# Patient Record
Sex: Female | Born: 1995 | Race: Black or African American | Hispanic: No | Marital: Single | Smoking: Never smoker
Health system: Southern US, Community
[De-identification: ages and names within clinical notes are randomized; demographics above are authoritative.]

## PROBLEM LIST (undated history)

## (undated) ENCOUNTER — Inpatient Hospital Stay (HOSPITAL_COMMUNITY): Payer: Self-pay

## (undated) DIAGNOSIS — Z789 Other specified health status: Secondary | ICD-10-CM

## (undated) HISTORY — PX: NO PAST SURGERIES: SHX2092

---

## 2015-10-10 NOTE — L&D Delivery Note (Signed)
Delivery Note At 3:50 AM a viable female was delivered via Vaginal, Spontaneous Delivery (presentation: LOA with left hand compound presentation;left arm swept without difficulty).  APGAR: 8, 9; weight pending  .   Placenta status: Veatrice KellsShultz, . 3V Cord:  Anesthesia:  Epidural Episiotomy: None Lacerations: Bilateral periurethral, hemostatis no repair Est. Blood Loss (mL): 75  Mom to postpartum.  Baby to Couplet care / Skin to Skin.  Stacie Diette 06/04/2016, 4:09 AM  I was present for the entire delivery of baby and placenta and inspection of the perineum and agree with above. Placenta to: Birthing suites Feeding: Breast Circ: OP Contraception: Condoms  [place] Pegge [place], CNM 06/04/2016 4:25 AM

## 2015-12-09 LAB — OB RESULTS CONSOLE GC/CHLAMYDIA
Chlamydia: NEGATIVE
GC PROBE AMP, GENITAL: NEGATIVE

## 2015-12-09 LAB — OB RESULTS CONSOLE HEPATITIS B SURFACE ANTIGEN: Hepatitis B Surface Ag: NEGATIVE

## 2015-12-09 LAB — OB RESULTS CONSOLE ABO/RH: RH TYPE: POSITIVE

## 2015-12-09 LAB — OB RESULTS CONSOLE HIV ANTIBODY (ROUTINE TESTING): HIV: NONREACTIVE

## 2015-12-09 LAB — OB RESULTS CONSOLE ANTIBODY SCREEN: ANTIBODY SCREEN: NEGATIVE

## 2015-12-09 LAB — OB RESULTS CONSOLE RUBELLA ANTIBODY, IGM: Rubella: IMMUNE

## 2015-12-09 LAB — OB RESULTS CONSOLE RPR
RPR: NONREACTIVE
RPR: NONREACTIVE

## 2016-02-20 ENCOUNTER — Encounter (HOSPITAL_COMMUNITY): Payer: Self-pay | Admitting: *Deleted

## 2016-02-20 ENCOUNTER — Emergency Department (HOSPITAL_COMMUNITY)
Admission: EM | Admit: 2016-02-20 | Discharge: 2016-02-20 | Disposition: A | Discharge status: Home / Self Care | Payer: Medicaid Other | Attending: Emergency Medicine | Admitting: Emergency Medicine

## 2016-02-20 ENCOUNTER — Emergency Department (HOSPITAL_COMMUNITY): Payer: Medicaid Other

## 2016-02-20 DIAGNOSIS — Z79899 Other long term (current) drug therapy: Secondary | ICD-10-CM | POA: Insufficient documentation

## 2016-02-20 DIAGNOSIS — E876 Hypokalemia: Secondary | ICD-10-CM | POA: Diagnosis not present

## 2016-02-20 DIAGNOSIS — O99282 Endocrine, nutritional and metabolic diseases complicating pregnancy, second trimester: Secondary | ICD-10-CM | POA: Diagnosis not present

## 2016-02-20 DIAGNOSIS — Z3A26 26 weeks gestation of pregnancy: Secondary | ICD-10-CM | POA: Diagnosis not present

## 2016-02-20 DIAGNOSIS — R109 Unspecified abdominal pain: Secondary | ICD-10-CM | POA: Diagnosis not present

## 2016-02-20 DIAGNOSIS — O26899 Other specified pregnancy related conditions, unspecified trimester: Secondary | ICD-10-CM

## 2016-02-20 DIAGNOSIS — O9989 Other specified diseases and conditions complicating pregnancy, childbirth and the puerperium: Secondary | ICD-10-CM | POA: Insufficient documentation

## 2016-02-20 LAB — URINALYSIS, ROUTINE W REFLEX MICROSCOPIC
BILIRUBIN URINE: NEGATIVE
GLUCOSE, UA: NEGATIVE mg/dL
HGB URINE DIPSTICK: NEGATIVE
KETONES UR: NEGATIVE mg/dL
NITRITE: NEGATIVE
PH: 7 (ref 5.0–8.0)
Protein, ur: NEGATIVE mg/dL
SPECIFIC GRAVITY, URINE: 1.015 (ref 1.005–1.030)

## 2016-02-20 LAB — COMPREHENSIVE METABOLIC PANEL
ALK PHOS: 56 U/L (ref 38–126)
ALT: 9 U/L — AB (ref 14–54)
AST: 16 U/L (ref 15–41)
Albumin: 2.4 g/dL — ABNORMAL LOW (ref 3.5–5.0)
Anion gap: 9 (ref 5–15)
BUN: <5 mg/dL — ABNORMAL LOW (ref 6–20)
CALCIUM: 8.3 mg/dL — AB (ref 8.9–10.3)
CHLORIDE: 108 mmol/L (ref 101–111)
CO2: 20 mmol/L — AB (ref 22–32)
CREATININE: 0.52 mg/dL (ref 0.44–1.00)
GFR calc Af Amer: >60 mL/min (ref >60)
GFR calc non Af Amer: >60 mL/min (ref >60)
GLUCOSE: 97 mg/dL (ref 65–99)
Potassium: 3.1 mmol/L — ABNORMAL LOW (ref 3.5–5.1)
SODIUM: 137 mmol/L (ref 135–145)
Total Bilirubin: 0.5 mg/dL (ref 0.3–1.2)
Total Protein: 5.7 g/dL — ABNORMAL LOW (ref 6.5–8.1)

## 2016-02-20 LAB — CBC WITH DIFFERENTIAL/PLATELET
Basophils Absolute: 0 10*3/uL (ref 0.0–0.1)
Basophils Relative: 0 %
EOS ABS: 0.2 10*3/uL (ref 0.0–0.7)
EOS PCT: 2 %
HCT: 30.3 % — ABNORMAL LOW (ref 36.0–46.0)
HEMOGLOBIN: 10 g/dL — AB (ref 12.0–15.0)
LYMPHS ABS: 1.9 10*3/uL (ref 0.7–4.0)
LYMPHS PCT: 24 %
MCH: 29.8 pg (ref 26.0–34.0)
MCHC: 33 g/dL (ref 30.0–36.0)
MCV: 90.2 fL (ref 78.0–100.0)
MONOS PCT: 8 %
Monocytes Absolute: 0.7 10*3/uL (ref 0.1–1.0)
NEUTROS PCT: 66 %
Neutro Abs: 5.1 10*3/uL (ref 1.7–7.7)
PLATELETS: 240 10*3/uL (ref 150–400)
RBC: 3.36 MIL/uL — ABNORMAL LOW (ref 3.87–5.11)
RDW: 13 % (ref 11.5–15.5)
WBC: 7.9 10*3/uL (ref 4.0–10.5)

## 2016-02-20 LAB — MAGNESIUM: MAGNESIUM: 1.8 mg/dL (ref 1.7–2.4)

## 2016-02-20 LAB — WET PREP, GENITAL
Clue Cells Wet Prep HPF POC: NONE SEEN
Sperm: NONE SEEN
TRICH WET PREP: NONE SEEN
YEAST WET PREP: NONE SEEN

## 2016-02-20 LAB — URINE MICROSCOPIC-ADD ON

## 2016-02-20 LAB — PREGNANCY, URINE: Preg Test, Ur: POSITIVE — AB

## 2016-02-20 MED ORDER — POTASSIUM CHLORIDE CRYS ER 20 MEQ PO TBCR
40.0000 meq | EXTENDED_RELEASE_TABLET | Freq: Once | ORAL | Status: AC
  Administered 2016-02-20: 40 meq via ORAL
  Filled 2016-02-20: qty 2

## 2016-02-20 MED ORDER — POTASSIUM CHLORIDE CRYS ER 20 MEQ PO TBCR: 40.0000 meq | EXTENDED_RELEASE_TABLET | Freq: Once | ORAL | Status: DC

## 2016-02-20 MED ORDER — POTASSIUM CHLORIDE CRYS ER 20 MEQ PO TBCR: 20.0000 meq | EXTENDED_RELEASE_TABLET | Freq: Every day | ORAL | Status: DC

## 2016-02-20 NOTE — ED Notes (Signed)
Pt off unit with ultrasound 

## 2016-02-20 NOTE — Progress Notes (Signed)
EFM dc'd. Pt to U/S.

## 2016-02-20 NOTE — ED Notes (Signed)
Rapid response OB Mandy RN called

## 2016-02-20 NOTE — Progress Notes (Signed)
Spoke with Dr. Despina HiddenEure. Pt is a G2P0 at 26 2/7 weeks who [facility] Pt is here today because she has felt sharp  pains in her abd. Denies vagianal bleeding or leaking of fluid. FHR tracing is a category 1 tracing with some ui. Says pt is probably feeling round ligament pain. Says to monitor pt 30 more min and then she can be obstetrically cleared. ED MD notified.

## 2016-02-20 NOTE — ED Notes (Signed)
Pt returned from ultrasound

## 2016-02-20 NOTE — ED Provider Notes (Signed)
CSN: [id number]     Arrival date & time 02/20/16  1152 History   First MD Initiated Contact with Patient 02/20/16 1559     Chief Complaint  Patient presents with  . Abdominal Pain    [redacted] weeks pregnant     (Consider location/radiation/quality/duration/timing/severity/associated sxs/prior Treatment) HPI   Blood pressure 100/67, pulse 68, temperature 98.4 F (36.9 C), temperature source Oral, resp. rate 20, weight 55.452 kg, last menstrual period 08/20/2015, SpO2 100 %.  Denise Castro is a 20 y.o. female G2 P0, prior elective abortion,  [redacted] weeks pregnant, [facility] with uncomplicated pregnancy, complaining of acute onset of severe, colicky diffuse abdominal pain onset at 4 AM  to 11 AM associated with feeling like the fetus is moving "like he is really distressed." Patient denies vaginal bleeding, vaginal discharge, dysuria, hematuria, nausea, vomiting, fever. Prior abdominal surgeries,  There was an associated severe pain in the right mid quadrant which is now resolved.  History reviewed. No pertinent past medical history. History reviewed. No pertinent past surgical history. No family history on file. Social History  Substance Use Topics  . Smoking status: Never Smoker   . Smokeless tobacco: None  . Alcohol Use: No   OB History    No data available     Review of Systems  10 systems reviewed and found to be negative, except as noted in the HPI.   Allergies  Review of patient's allergies indicates no known allergies.  Home Medications   Prior to Admission medications   Medication Sig Start Date End Date Taking? Authorizing Provider  Prenatal Vit-Fe Fumarate-FA (MULTIVITAMIN-PRENATAL) 27-0.8 MG TABS tablet Take 1 tablet by mouth daily at 12 noon.   Yes Historical Provider, MD  potassium chloride SA (K-DUR,KLOR-CON) 20 MEQ tablet Take 1 tablet (20 mEq total) by mouth daily. 02/20/16   Kycen Spalla, PA-C   BP 96/62 mmHg   Pulse 41  Temp(Src) 98.4 F (36.9 C) (Oral)  Resp 18  Wt 55.452 kg  SpO2 95%  LMP 08/20/2015 Physical Exam  Constitutional: She is oriented to person, place, and time. She appears well-developed and well-nourished. No distress.  HENT:  Head: Normocephalic.  Eyes: Conjunctivae and EOM are normal.  Cardiovascular: Normal rate.   Pulmonary/Chest: Effort normal. No stridor. No respiratory distress. She has no wheezes. She has no rales. She exhibits no tenderness.  Abdominal: Soft. Bowel sounds are normal. She exhibits no distension and no mass. There is no tenderness. There is no rebound and no guarding.  Uterus gravid above the level of the umbilicus  Genitourinary:  Exam is chaperoned by nurse Wille CelesteJanie: No rashes or lesions, no abnormal vaginal discharge, bleeding, os is closed, no cervical motion or adnexal tenderness.  Musculoskeletal: Normal range of motion.  Neurological: She is alert and oriented to person, place, and time.  Psychiatric: She has a normal mood and affect.  Nursing note and vitals reviewed.   ED Course  Procedures (including critical care time) Labs Review Labs Reviewed  WET PREP, GENITAL - Abnormal; Notable for the following:    WBC, Wet Prep HPF POC FEW (*)    All other components within normal limits  CBC WITH DIFFERENTIAL/PLATELET - Abnormal; Notable for the following:    RBC 3.36 (*)    Hemoglobin 10.0 (*)    HCT 30.3 (*)    All other components within normal limits  COMPREHENSIVE METABOLIC PANEL - Abnormal; Notable for the following:  Potassium 3.1 (*)    CO2 20 (*)    BUN <5 (*)    Calcium 8.3 (*)    Total Protein 5.7 (*)    Albumin 2.4 (*)    ALT 9 (*)    All other components within normal limits  URINALYSIS, ROUTINE W REFLEX MICROSCOPIC (NOT AT [facility]) - Abnormal; Notable for the following:    APPearance CLOUDY (*)    Leukocytes, UA TRACE (*)    All other components within normal limits  URINE MICROSCOPIC-ADD ON - Abnormal; Notable for the  following:    Squamous Epithelial / LPF 6-30 (*)    Bacteria, UA RARE (*)    All other components within normal limits  PREGNANCY, URINE - Abnormal; Notable for the following:    Preg Test, Ur POSITIVE (*)    All other components within normal limits  MAGNESIUM  POC URINE PREG, ED  GC/CHLAMYDIA PROBE AMP ([place]) NOT AT [facility]    Imaging Review US Ob Comp + 14 Wk  02/20/2016  CLINICAL DATA:  Severe abdominal pain EXAM: LIMITED OBSTETRIC ULTRASOUND FINDINGS: Number of Fetuses:  1 Heart Rate:  155 bpm Movement:  Present Presentation: Cephalic Placental Location: Lateral right Previa: No Amniotic Fluid (Subjective):  Within normal limits. BPD:  6.57cm 26w  4d MATERNAL FINDINGS: Cervix:  Appears closed. Uterus/Adnexae:  No abnormality visualized. IMPRESSION: There is single live intrauterine gestation in cephalic presentation. No placenta previa. Fetal heart rate 155 BPM. Normal appearing amniotic fluid. BPD measures 6.57 cm corresponding to gestational age [redacted] weeks and 4 days. EDC by ultrasound 05/24/2016. The cervix appears closed. This exam is performed on an emergent basis and does not comprehensively evaluate fetal size, dating, or anatomy; follow-up complete OB US should be considered if further fetal assessment is warranted. Electronically Signed   By: Natasha Mead M.D.   On: 02/20/2016 17:53   I have personally reviewed and evaluated these images and lab results as part of my medical decision-making.   EKG Interpretation None      MDM   Final diagnoses:  Abdominal pain affecting pregnancy  Hypokalemia    Filed Vitals:   02/20/16 1606 02/20/16 1630 02/20/16 1700 02/20/16 1800  BP: 100/89 92/59 98/60  96/62  Pulse: 84 73 70 41  Temp:      TempSrc:      Resp: 18  18   Weight:      SpO2: 100% 100% 100% 95%    Medications  potassium chloride SA (K-DUR,KLOR-CON) CR tablet 40 mEq (40 mEq Oral Given 02/20/16 1907)    Denise Castro is 20 y.o. female presenting with Abdominal  pain onset at 4 AM lasting till 73 AM with increased fetal movement. Patient is [redacted] weeks pregnant, uncomplicated pregnancy, she denies vaginal bleeding or vaginal discharge, abdominal exam is benign and the pain is completely resolved up until this point. Rapid response nurse has had a tracing for 30 minutes with normal fetal heart tones, she is cleared from OB/GYN perspective, blood work with a mild hypokalemia and normal magnesium, will replete orally. Patient's normal OB ultrasound,  Wet prep with no significant abnormality, patient has remained normal pain-free while in the ED, repeat abdominal exam is benign, she is tolerating by mouth's, remains afebrile. I doubt that this is appendicitis. Patient will follow with her OB/GYN next week, we have had an extensive discussion of return precautions and patient verbalizes her understanding. Will supplement potassium orally.  Evaluation does not show pathology that would require ongoing  emergent intervention or inpatient treatment. Pt is hemodynamically stable and mentating appropriately. Discussed findings and plan with patient/guardian, who agrees with care plan. All questions answered. Return precautions discussed and outpatient follow up given.   New Prescriptions   POTASSIUM CHLORIDE SA (K-DUR,KLOR-CON) 20 MEQ TABLET    Take 1 tablet (20 mEq total) by mouth daily.          Wynetta Emery, PA-C 02/20/16 2009  Arby Barrette, MD 02/24/16 [phone]

## 2016-02-20 NOTE — Progress Notes (Signed)
Spoke with Dr. Despina HiddenEure. Pt has been taken for U/S since she said she has been having abd pain. Pt denies any abd pain. Pt has not had abd pain since I have been monitoring her her. Says he wants [facility] to wait for the results of the U/S before I sign off.

## 2016-02-20 NOTE — Progress Notes (Signed)
Pt is a G2P0 at 26 2/[redacted] weeks gestation wiith occasional sharp abd pains. Says baby has been moving more than normal and she felt like the baby was in distress. [facility] Denies any problems with  This pregnancy. No vaginal bleeding or leaking of fluid. Says her EDC is 05/26/2016 by U/S.

## 2016-02-20 NOTE — Discharge Instructions (Signed)
Please follow with your primary care doctor in the next 2 days for a check-up. They must obtain records for further management.  ° °Do not hesitate to return to the Emergency Department for any new, worsening or concerning symptoms.  ° °

## 2016-02-20 NOTE — ED Notes (Addendum)
Pt is [redacted] weeks pregnant.  States last night she began experiencing diffuse abdominal pain that she does not describe as cramping.  She states after that started the baby kept moving "like he was really distressed".  Denies vaginal discharge, bleeding or changes in bowel or bladder habits.  FHR 149 with doppler.

## 2016-02-20 NOTE — Progress Notes (Signed)
Spoke with Dr. Despina HiddenEure. Ultra sound results are normal. Pt is fine to be d/c. ED MD notified.

## 2016-02-20 NOTE — Progress Notes (Signed)
Pt denies any urinary symptoms or vaginal d/c.

## 2016-02-21 LAB — GC/CHLAMYDIA PROBE AMP (~~LOC~~) NOT AT ARMC
CHLAMYDIA, DNA PROBE: NEGATIVE
Neisseria Gonorrhea: NEGATIVE

## 2016-05-01 LAB — OB RESULTS CONSOLE GBS: STREP GROUP B AG: POSITIVE

## 2016-05-10 ENCOUNTER — Inpatient Hospital Stay (HOSPITAL_COMMUNITY): Payer: Medicaid Other

## 2016-05-10 ENCOUNTER — Inpatient Hospital Stay (HOSPITAL_COMMUNITY)
Admission: AD | Admit: 2016-05-10 | Discharge: 2016-05-10 | Disposition: A | Discharge status: Home / Self Care | Payer: Medicaid Other | Source: Ambulatory Visit | Attending: Obstetrics & Gynecology | Admitting: Obstetrics & Gynecology

## 2016-05-10 ENCOUNTER — Encounter (HOSPITAL_COMMUNITY): Payer: Self-pay | Admitting: *Deleted

## 2016-05-10 DIAGNOSIS — Z3A37 37 weeks gestation of pregnancy: Secondary | ICD-10-CM | POA: Insufficient documentation

## 2016-05-10 DIAGNOSIS — O471 False labor at or after 37 completed weeks of gestation: Secondary | ICD-10-CM | POA: Insufficient documentation

## 2016-05-10 DIAGNOSIS — O26893 Other specified pregnancy related conditions, third trimester: Secondary | ICD-10-CM

## 2016-05-10 DIAGNOSIS — N898 Other specified noninflammatory disorders of vagina: Secondary | ICD-10-CM | POA: Diagnosis present

## 2016-05-10 DIAGNOSIS — O479 False labor, unspecified: Secondary | ICD-10-CM

## 2016-05-10 HISTORY — DX: Other specified health status: Z78.9

## 2016-05-10 LAB — AMNISURE RUPTURE OF MEMBRANE (ROM) NOT AT ARMC: AMNISURE: POSITIVE

## 2016-05-10 LAB — POCT FERN TEST: POCT Fern Test: NEGATIVE

## 2016-05-10 NOTE — MAU Provider Note (Signed)
[id number]   Chief Complaint  Patient presents with  . Rupture of Membranes    HPI Denise Castro is a 20 y.o. female  G1P0 here with report of watery vaginal discharge that began at approximately 0830 immediately following intercourse with her boyfriend. She denies requiring a pad for the leakage but described it as clear and running down her leg.  Leaking of fluid has not continued.  Pt reports irregular mild contractions that are unchanged from last few days and denies vaginal bleeding.   +fetal movement.   All other systems negative.    Patient's last menstrual period was 08/20/2015.  OB History  Gravida Para Term Preterm AB Living  1            SAB TAB Ectopic Multiple Live Births               # Outcome Date GA Lbr Len/2nd Weight Sex Delivery Anes PTL Lv  1 Current               Past Medical History:  Diagnosis Date  . Medical history non-contributory     No family history on file.  Social History   Social History  . Marital status: Single    Spouse name: N/A  . Number of children: N/A  . Years of education: N/A   Social History Main Topics  . Smoking status: Never Smoker  . Smokeless tobacco: Never Used  . Alcohol use No  . Drug use: No  . Sexual activity: Yes    Birth control/ protection: None   Other Topics Concern  . None   Social History Narrative  . None    No Known Allergies  No current facility-administered medications on file prior to encounter.    No current outpatient prescriptions on file prior to encounter.     Review of Systems  Constitutional: Negative for chills, fatigue and fever.  Eyes: Negative for visual disturbance.  Respiratory: Negative for shortness of breath.   Cardiovascular: Negative for chest pain.  Gastrointestinal: Negative for abdominal pain, nausea and vomiting.  Genitourinary: Positive for vaginal discharge. Negative for difficulty urinating, dysuria, flank pain, pelvic pain, vaginal bleeding and vaginal pain.   Neurological: Negative for dizziness and headaches.  Psychiatric/Behavioral: Negative.      Physical Exam   Vitals:   05/10/16 0859  BP: 98/66  Pulse: 85  Resp: 18  Temp: 98.2 F (36.8 C)    Physical Exam VS reviewed, nursing note reviewed,  Constitutional: well developed, well nourished, no distress HEENT: normocephalic CV: normal rate Pulm/chest wall: normal effort Abdomen: soft, gravid appropriate for gestation Neuro: alert and oriented x 3 Skin: warm, dry Psych: affect normal  Pelvic exam: Cervix pink, visually closed, without lesion, scant white creamy discharge, negative pooling of fluid with valsalva, vaginal walls and external genitalia normal  Dilation: 1 Effacement (%): 50 Station: -2 Presentation: Vertex Exam by:: L.Leftwich-Kirby,CNM   Ferning slide equivocal with possible tiny ferning but not definitive.  Amnisure ordered. Amnisure collected by RN after cervical exam and swab was light red/pink throughout.    MAU Course  Procedures  MDM Initial exam with negative pooling and ferning slide equivocal without definitive ferning. Amnisure collected after cervical exam so scant bleeding present and may have influenced results, as test was positive.  Consult Dr Penne Lash.  AFI wnl and SSE performed a second time with no pooling and negative ferning on slide allowed to dry x 10 minutes.  Scant pink mucous noted on exam,  c/w bleeding following cervical exam/intercourse.  No bright red bleeding noted.  Unlikely ROM, more likely false positive amnisure secondary to bleeding from cervical exam/intercourse.  D/C home with bleeding/labor precautions.  Discussed reasons to return to MAU.  Assessment and Plan  19 y.o. G1P0 @[redacted]w[redacted]d  presents with leakage of clear fluid following intercourse 1. Threatened labor at term   2. [redacted] weeks gestation of pregnancy   3. Vaginal discharge during pregnancy in third trimester    D/C home with labor and bleeding precautions F/U with  prenatal visits as schedule Return to MAU as needed for s/sx of labor or emergencies  Hurshel Party, CNM 05/10/2016 9:29 AM

## 2016-05-10 NOTE — Discharge Instructions (Signed)
Reasons to return to MAU: ° °1.  Contractions are  5 minutes apart or less, each last 1 minute, these have been going on for 1-2 hours, and you cannot walk or talk during them °2.  You have a large gush of fluid, or a trickle of fluid that will not stop and you have to wear a pad °3.  You have bleeding that is bright red, heavier than spotting--like menstrual bleeding (spotting can be normal in early labor or after a check of your cervix) °4.  You do not feel the baby moving like he/she normally does ° °Third Trimester of Pregnancy °The third trimester is from week 29 through week 42, months 7 through 9. The third trimester is a time when the fetus is growing rapidly. At the end of the ninth month, the fetus is about 20 inches in length and weighs 6-10 pounds.  °BODY CHANGES °Your body goes through many changes during pregnancy. The changes vary from woman to woman.  °· Your weight will continue to increase. You can expect to gain 25-35 pounds (11-16 kg) by the end of the pregnancy. °· You may begin to get stretch marks on your hips, abdomen, and breasts. °· You may urinate more often because the fetus is moving lower into your pelvis and pressing on your bladder. °· You may develop or continue to have heartburn as a result of your pregnancy. °· You may develop constipation because certain hormones are causing the muscles that push waste through your intestines to slow down. °· You may develop hemorrhoids or swollen, bulging veins (varicose veins). °· You may have pelvic pain because of the weight gain and pregnancy hormones relaxing your joints between the bones in your pelvis. Backaches may result from overexertion of the muscles supporting your posture. °· You may have changes in your hair. These can include thickening of your hair, rapid growth, and changes in texture. Some women also have hair loss during or after pregnancy, or hair that feels dry or thin. Your hair will most likely return to normal after your  baby is born. °· Your breasts will continue to grow and be tender. A yellow discharge may leak from your breasts called colostrum. °· Your belly button may stick out. °· You may feel short of breath because of your expanding uterus. °· You may notice the fetus "dropping," or moving lower in your abdomen. °· You may have a bloody mucus discharge. This usually occurs a few days to a week before labor begins. °· Your cervix becomes thin and soft (effaced) near your due date. °WHAT TO EXPECT AT YOUR PRENATAL EXAMS  °You will have prenatal exams every 2 weeks until week 36. Then, you will have weekly prenatal exams. During a routine prenatal visit: °· You will be weighed to make sure you and the fetus are growing normally. °· Your blood pressure is taken. °· Your abdomen will be measured to track your baby's growth. °· The fetal heartbeat will be listened to. °· Any test results from the previous visit will be discussed. °· You may have a cervical check near your due date to see if you have effaced. °At around 36 weeks, your caregiver will check your cervix. At the same time, your caregiver will also perform a test on the secretions of the vaginal tissue. This test is to determine if a type of bacteria, Group B streptococcus, is present. Your caregiver will explain this further. °Your caregiver may ask you: °· What your   birth plan is.  How you are feeling.  If you are feeling the baby move.  If you have had any abnormal symptoms, such as leaking fluid, bleeding, severe headaches, or abdominal cramping.  If you are using any tobacco products, including cigarettes, chewing tobacco, and electronic cigarettes.  If you have any questions. Other tests or screenings that may be performed during your third trimester include:  Blood tests that check for low iron levels (anemia).  Fetal testing to check the health, activity level, and growth of the fetus. Testing is done if you have certain medical conditions or if  there are problems during the pregnancy.  HIV (human immunodeficiency virus) testing. If you are at high risk, you may be screened for HIV during your third trimester of pregnancy. FALSE LABOR You may feel small, irregular contractions that eventually go away. These are called Braxton Hicks contractions, or false labor. Contractions may last for hours, days, or even weeks before true labor sets in. If contractions come at regular intervals, intensify, or become painful, it is best to be seen by your caregiver.  SIGNS OF LABOR   Menstrual-like cramps.  Contractions that are 5 minutes apart or less.  Contractions that start on the top of the uterus and spread down to the lower abdomen and back.  A sense of increased pelvic pressure or back pain.  A watery or bloody mucus discharge that comes from the vagina. If you have any of these signs before the 37th week of pregnancy, call your caregiver right away. You need to go to the hospital to get checked immediately. HOME CARE INSTRUCTIONS   Avoid all smoking, herbs, alcohol, and unprescribed drugs. These chemicals affect the formation and growth of the baby.  Do not use any tobacco products, including cigarettes, chewing tobacco, and electronic cigarettes. If you need help quitting, ask your health care provider. You may receive counseling support and other resources to help you quit.  Follow your caregiver's instructions regarding medicine use. There are medicines that are either safe or unsafe to take during pregnancy.  Exercise only as directed by your caregiver. Experiencing uterine cramps is a good sign to stop exercising.  Continue to eat regular, healthy meals.  Wear a good support bra for breast tenderness.  Do not use hot tubs, steam rooms, or saunas.  Wear your seat belt at all times when driving.  Avoid raw meat, uncooked cheese, cat litter boxes, and soil used by cats. These carry germs that can cause birth defects in the  baby.  Take your prenatal vitamins.  Take 1500-2000 mg of calcium daily starting at the 20th week of pregnancy until you deliver your baby.  Try taking a stool softener (if your caregiver approves) if you develop constipation. Eat more high-fiber foods, such as fresh vegetables or fruit and whole grains. Drink plenty of fluids to keep your urine clear or pale yellow.  Take warm sitz baths to soothe any pain or discomfort caused by hemorrhoids. Use hemorrhoid cream if your caregiver approves.  If you develop varicose veins, wear support hose. Elevate your feet for 15 minutes, 3-4 times a day. Limit salt in your diet.  Avoid heavy lifting, wear low heal shoes, and practice good posture.  Rest a lot with your legs elevated if you have leg cramps or low back pain.  Visit your dentist if you have not gone during your pregnancy. Use a soft toothbrush to brush your teeth and be gentle when you floss.  A  sexual relationship may be continued unless your caregiver directs you otherwise.  Do not travel far distances unless it is absolutely necessary and only with the approval of your caregiver.  Take prenatal classes to understand, practice, and ask questions about the labor and delivery.  Make a trial run to the hospital.  Pack your hospital bag.  Prepare the baby's nursery.  Continue to go to all your prenatal visits as directed by your caregiver. SEEK MEDICAL CARE IF:  You are unsure if you are in labor or if your water has broken.  You have dizziness.  You have mild pelvic cramps, pelvic pressure, or nagging pain in your abdominal area.  You have persistent nausea, vomiting, or diarrhea.  You have a bad smelling vaginal discharge.  You have pain with urination. SEEK IMMEDIATE MEDICAL CARE IF:   You have a fever.  You are leaking fluid from your vagina.  You have spotting or bleeding from your vagina.  You have severe abdominal cramping or pain.  You have rapid weight  loss or gain.  You have shortness of breath with chest pain.  You notice sudden or extreme swelling of your face, hands, ankles, feet, or legs.  You have not felt your baby move in over an hour.  You have severe headaches that do not go away with medicine.  You have vision changes.   This information is not intended to replace advice given to you by your health care provider. Make sure you discuss any questions you have with your health care provider.   Document Released: 09/19/2001 Document Revised: 10/16/2014 Document Reviewed: 11/26/2012 Elsevier Interactive Patient Education [employer]2016 [employer]

## 2016-05-10 NOTE — MAU Note (Signed)
Leaking fluid, still coming.

## 2016-05-12 ENCOUNTER — Encounter (HOSPITAL_COMMUNITY): Payer: Self-pay | Admitting: *Deleted

## 2016-05-12 ENCOUNTER — Inpatient Hospital Stay (HOSPITAL_COMMUNITY)
Admission: AD | Admit: 2016-05-12 | Discharge: 2016-05-12 | Disposition: A | Discharge status: Home / Self Care | Payer: Medicaid Other | Source: Ambulatory Visit | Attending: Obstetrics & Gynecology | Admitting: Obstetrics & Gynecology

## 2016-05-12 DIAGNOSIS — O26893 Other specified pregnancy related conditions, third trimester: Secondary | ICD-10-CM

## 2016-05-12 DIAGNOSIS — N898 Other specified noninflammatory disorders of vagina: Secondary | ICD-10-CM

## 2016-05-12 DIAGNOSIS — Z79899 Other long term (current) drug therapy: Secondary | ICD-10-CM | POA: Diagnosis not present

## 2016-05-12 DIAGNOSIS — Z3A38 38 weeks gestation of pregnancy: Secondary | ICD-10-CM | POA: Diagnosis not present

## 2016-05-12 DIAGNOSIS — O4292 Full-term premature rupture of membranes, unspecified as to length of time between rupture and onset of labor: Secondary | ICD-10-CM | POA: Diagnosis not present

## 2016-05-12 DIAGNOSIS — Z3493 Encounter for supervision of normal pregnancy, unspecified, third trimester: Secondary | ICD-10-CM

## 2016-05-12 LAB — WET PREP, GENITAL
Clue Cells Wet Prep HPF POC: NONE SEEN
SPERM: NONE SEEN
Trich, Wet Prep: NONE SEEN
YEAST WET PREP: NONE SEEN

## 2016-05-12 LAB — AMNISURE RUPTURE OF MEMBRANE (ROM) NOT AT ARMC: AMNISURE: NEGATIVE

## 2016-05-12 LAB — POCT FERN TEST: POCT FERN TEST: NEGATIVE

## 2016-05-12 NOTE — Discharge Instructions (Signed)
Third Trimester of Pregnancy °The third trimester is from week 29 through week 42, months 7 through 9. The third trimester is a time when the fetus is growing rapidly. At the end of the ninth month, the fetus is about 20 inches in length and weighs 6-10 pounds.  °BODY CHANGES °Your body goes through many changes during pregnancy. The changes vary from woman to woman.  °· Your weight will continue to increase. You can expect to gain 25-35 pounds (11-16 kg) by the end of the pregnancy. °· You may begin to get stretch marks on your hips, abdomen, and breasts. °· You may urinate more often because the fetus is moving lower into your pelvis and pressing on your bladder. °· You may develop or continue to have heartburn as a result of your pregnancy. °· You may develop constipation because certain hormones are causing the muscles that push waste through your intestines to slow down. °· You may develop hemorrhoids or swollen, bulging veins (varicose veins). °· You may have pelvic pain because of the weight gain and pregnancy hormones relaxing your joints between the bones in your pelvis. Backaches may result from overexertion of the muscles supporting your posture. °· You may have changes in your hair. These can include thickening of your hair, rapid growth, and changes in texture. Some women also have hair loss during or after pregnancy, or hair that feels dry or thin. Your hair will most likely return to normal after your baby is born. °· Your breasts will continue to grow and be tender. A yellow discharge may leak from your breasts called colostrum. °· Your belly button may stick out. °· You may feel short of breath because of your expanding uterus. °· You may notice the fetus "dropping," or moving lower in your abdomen. °· You may have a bloody mucus discharge. This usually occurs a few days to a week before labor begins. °· Your cervix becomes thin and soft (effaced) near your due date. °WHAT TO EXPECT AT YOUR PRENATAL  EXAMS  °You will have prenatal exams every 2 weeks until week 36. Then, you will have weekly prenatal exams. During a routine prenatal visit: °· You will be weighed to make sure you and the fetus are growing normally. °· Your blood pressure is taken. °· Your abdomen will be measured to track your baby's growth. °· The fetal heartbeat will be listened to. °· Any test results from the previous visit will be discussed. °· You may have a cervical check near your due date to see if you have effaced. °At around 36 weeks, your caregiver will check your cervix. At the same time, your caregiver will also perform a test on the secretions of the vaginal tissue. This test is to determine if a type of bacteria, Group B streptococcus, is present. Your caregiver will explain this further. °Your caregiver may ask you: °· What your birth plan is. °· How you are feeling. °· If you are feeling the baby move. °· If you have had any abnormal symptoms, such as leaking fluid, bleeding, severe headaches, or abdominal cramping. °· If you are using any tobacco products, including cigarettes, chewing tobacco, and electronic cigarettes. °· If you have any questions. °Other tests or screenings that may be performed during your third trimester include: °· Blood tests that check for low iron levels (anemia). °· Fetal testing to check the health, activity level, and growth of the fetus. Testing is done if you have certain medical conditions or if   there are problems during the pregnancy. °· HIV (human immunodeficiency virus) testing. If you are at high risk, you may be screened for HIV during your third trimester of pregnancy. °FALSE LABOR °You may feel small, irregular contractions that eventually go away. These are called Braxton Hicks contractions, or false labor. Contractions may last for hours, days, or even weeks before true labor sets in. If contractions come at regular intervals, intensify, or become painful, it is best to be seen by your  caregiver.  °SIGNS OF LABOR  °· Menstrual-like cramps. °· Contractions that are 5 minutes apart or less. °· Contractions that start on the top of the uterus and spread down to the lower abdomen and back. °· A sense of increased pelvic pressure or back pain. °· A watery or bloody mucus discharge that comes from the vagina. °If you have any of these signs before the 37th week of pregnancy, call your caregiver right away. You need to go to the hospital to get checked immediately. °HOME CARE INSTRUCTIONS  °· Avoid all smoking, herbs, alcohol, and unprescribed drugs. These chemicals affect the formation and growth of the baby. °· Do not use any tobacco products, including cigarettes, chewing tobacco, and electronic cigarettes. If you need help quitting, ask your health care provider. You may receive counseling support and other resources to help you quit. °· Follow your caregiver's instructions regarding medicine use. There are medicines that are either safe or unsafe to take during pregnancy. °· Exercise only as directed by your caregiver. Experiencing uterine cramps is a good sign to stop exercising. °· Continue to eat regular, healthy meals. °· Wear a good support bra for breast tenderness. °· Do not use hot tubs, steam rooms, or saunas. °· Wear your seat belt at all times when driving. °· Avoid raw meat, uncooked cheese, cat litter boxes, and soil used by cats. These carry germs that can cause birth defects in the baby. °· Take your prenatal vitamins. °· Take 1500-2000 mg of calcium daily starting at the 20th week of pregnancy until you deliver your baby. °· Try taking a stool softener (if your caregiver approves) if you develop constipation. Eat more high-fiber foods, such as fresh vegetables or fruit and whole grains. Drink plenty of fluids to keep your urine clear or pale yellow. °· Take warm sitz baths to soothe any pain or discomfort caused by hemorrhoids. Use hemorrhoid cream if your caregiver approves. °· If  you develop varicose veins, wear support hose. Elevate your feet for 15 minutes, 3-4 times a day. Limit salt in your diet. °· Avoid heavy lifting, wear low heal shoes, and practice good posture. °· Rest a lot with your legs elevated if you have leg cramps or low back pain. °· Visit your dentist if you have not gone during your pregnancy. Use a soft toothbrush to brush your teeth and be gentle when you floss. °· A sexual relationship may be continued unless your caregiver directs you otherwise. °· Do not travel far distances unless it is absolutely necessary and only with the approval of your caregiver. °· Take prenatal classes to understand, practice, and ask questions about the labor and delivery. °· Make a trial run to the hospital. °· Pack your hospital bag. °· Prepare the baby's nursery. °· Continue to go to all your prenatal visits as directed by your caregiver. °SEEK MEDICAL CARE IF: °· You are unsure if you are in labor or if your water has broken. °· You have dizziness. °· You have   mild pelvic cramps, pelvic pressure, or nagging pain in your abdominal area. °· You have persistent nausea, vomiting, or diarrhea. °· You have a bad smelling vaginal discharge. °· You have pain with urination. °SEEK IMMEDIATE MEDICAL CARE IF:  °· You have a fever. °· You are leaking fluid from your vagina. °· You have spotting or bleeding from your vagina. °· You have severe abdominal cramping or pain. °· You have rapid weight loss or gain. °· You have shortness of breath with chest pain. °· You notice sudden or extreme swelling of your face, hands, ankles, feet, or legs. °· You have not felt your baby move in over an hour. °· You have severe headaches that do not go away with medicine. °· You have vision changes. °  °This information is not intended to replace advice given to you by your health care provider. Make sure you discuss any questions you have with your health care provider. °  °Document Released: 09/19/2001 Document  Revised: 10/16/2014 Document Reviewed: 11/26/2012 °Elsevier Interactive Patient Education ©2016 [employer] °Fetal Movement Counts °Patient Name: __________________________________________________ Patient Due Date: ____________________ °Performing a fetal movement count is highly recommended in high-risk pregnancies, but it is good for every pregnant woman to do. Your health care provider may ask you to start counting fetal movements at 28 weeks of the pregnancy. Fetal movements often increase: °· After eating a full meal. °· After physical activity. °· After eating or drinking something sweet or cold. °· At rest. °Pay attention to when you feel the baby is most active. This will help you notice a pattern of your baby's sleep and wake cycles and what factors contribute to an increase in fetal movement. It is important to perform a fetal movement count at the same time each day when your baby is normally most active.  °HOW TO COUNT FETAL MOVEMENTS °1. Find a quiet and comfortable area to sit or lie down on your left side. Lying on your left side provides the best blood and oxygen circulation to your baby. °2. Write down the day and time on a sheet of paper or in a journal. °3. Start counting kicks, flutters, swishes, rolls, or jabs in a 2-hour period. You should feel at least 10 movements within 2 hours. °4. If you do not feel 10 movements in 2 hours, wait 2-3 hours and count again. Look for a change in the pattern or not enough counts in 2 hours. °SEEK MEDICAL CARE IF: °· You feel less than 10 counts in 2 hours, tried twice. °· There is no movement in over an hour. °· The pattern is changing or taking longer each day to reach 10 counts in 2 hours. °· You feel the baby is not moving as he or she usually does. °Date: ____________ Movements: ____________ Start time: ____________ Finish time: ____________  °Date: ____________ Movements: ____________ Start time: ____________ Finish time: ____________ °Date:  ____________ Movements: ____________ Start time: ____________ Finish time: ____________ °Date: ____________ Movements: ____________ Start time: ____________ Finish time: ____________ °Date: ____________ Movements: ____________ Start time: ____________ Finish time: ____________ °Date: ____________ Movements: ____________ Start time: ____________ Finish time: ____________ °Date: ____________ Movements: ____________ Start time: ____________ Finish time: ____________ °Date: ____________ Movements: ____________ Start time: ____________ Finish time: ____________  °Date: ____________ Movements: ____________ Start time: ____________ Finish time: ____________ °Date: ____________ Movements: ____________ Start time: ____________ Finish time: ____________ °Date: ____________ Movements: ____________ Start time: ____________ Finish time: ____________ °Date: ____________ Movements: ____________ Start time: ____________ Finish time: ____________ °Date:   ____________ Movements: ____________ Start time: ____________ Finish time: ____________ °Date: ____________ Movements: ____________ Start time: ____________ Finish time: ____________ °Date: ____________ Movements: ____________ Start time: ____________ Finish time: ____________  °Date: ____________ Movements: ____________ Start time: ____________ Finish time: ____________ °Date: ____________ Movements: ____________ Start time: ____________ Finish time: ____________ °Date: ____________ Movements: ____________ Start time: ____________ Finish time: ____________ °Date: ____________ Movements: ____________ Start time: ____________ Finish time: ____________ °Date: ____________ Movements: ____________ Start time: ____________ Finish time: ____________ °Date: ____________ Movements: ____________ Start time: ____________ Finish time: ____________ °Date: ____________ Movements: ____________ Start time: ____________ Finish time: ____________  °Date: ____________ Movements: ____________ Start  time: ____________ Finish time: ____________ °Date: ____________ Movements: ____________ Start time: ____________ Finish time: ____________ °Date: ____________ Movements: ____________ Start time: ____________ Finish time: ____________ °Date: ____________ Movements: ____________ Start time: ____________ Finish time: ____________ °Date: ____________ Movements: ____________ Start time: ____________ Finish time: ____________ °Date: ____________ Movements: ____________ Start time: ____________ Finish time: ____________ °Date: ____________ Movements: ____________ Start time: ____________ Finish time: ____________  °Date: ____________ Movements: ____________ Start time: ____________ Finish time: ____________ °Date: ____________ Movements: ____________ Start time: ____________ Finish time: ____________ °Date: ____________ Movements: ____________ Start time: ____________ Finish time: ____________ °Date: ____________ Movements: ____________ Start time: ____________ Finish time: ____________ °Date: ____________ Movements: ____________ Start time: ____________ Finish time: ____________ °Date: ____________ Movements: ____________ Start time: ____________ Finish time: ____________ °Date: ____________ Movements: ____________ Start time: ____________ Finish time: ____________  °Date: ____________ Movements: ____________ Start time: ____________ Finish time: ____________ °Date: ____________ Movements: ____________ Start time: ____________ Finish time: ____________ °Date: ____________ Movements: ____________ Start time: ____________ Finish time: ____________ °Date: ____________ Movements: ____________ Start time: ____________ Finish time: ____________ °Date: ____________ Movements: ____________ Start time: ____________ Finish time: ____________ °Date: ____________ Movements: ____________ Start time: ____________ Finish time: ____________ °Date: ____________ Movements: ____________ Start time: ____________ Finish time: ____________    °Date: ____________ Movements: ____________ Start time: ____________ Finish time: ____________ °Date: ____________ Movements: ____________ Start time: ____________ Finish time: ____________ °Date: ____________ Movements: ____________ Start time: ____________ Finish time: ____________ °Date: ____________ Movements: ____________ Start time: ____________ Finish time: ____________ °Date: ____________ Movements: ____________ Start time: ____________ Finish time: ____________ °Date: ____________ Movements: ____________ Start time: ____________ Finish time: ____________ °Date: ____________ Movements: ____________ Start time: ____________ Finish time: ____________  °Date: ____________ Movements: ____________ Start time: ____________ Finish time: ____________ °Date: ____________ Movements: ____________ Start time: ____________ Finish time: ____________ °Date: ____________ Movements: ____________ Start time: ____________ Finish time: ____________ °Date: ____________ Movements: ____________ Start time: ____________ Finish time: ____________ °Date: ____________ Movements: ____________ Start time: ____________ Finish time: ____________ °Date: ____________ Movements: ____________ Start time: ____________ Finish time: ____________ °  °This information is not intended to replace advice given to you by your health care provider. Make sure you discuss any questions you have with your health care provider. °  °Document Released: 10/25/2006 Document Revised: 10/16/2014 Document Reviewed: 07/22/2012 °Elsevier Interactive Patient Education ©2016 [employer] Inc[employer] ° °

## 2016-05-12 NOTE — MAU Provider Note (Signed)
History     CSN: [id number]  Arrival date and time: 05/12/16 3845   First Provider Initiated Contact with Patient 05/12/16 1022     Chief Complaint  Patient presents with  . Contractions  . Rupture of Membranes   HPI   Patient was just previously seen in MAU two days ago for same complaint, with neg fern, neg pooling, +Amnisure (but bloody sample, and recent intercourse unprotected), normal AFI.  Since MAU visit, patient states she has one new episode of a fluid "gush" which occurred after using the bathroom to urinate. She stood up after urinating and fluid came out of her vagina onto the floor. The fluid was clear. No continually leaking after that. No bleeding. No recent intercourse in the last 48 hours.   Good fetal movement. Some contractions which are not painful. No vaginal bleeding since leaving MAU 2 days ago. No fevers/chills.  OB History    Gravida Para Term Preterm AB Living   1             SAB TAB Ectopic Multiple Live Births                  Past Medical History:  Diagnosis Date  . Medical history non-contributory     Past Surgical History:  Procedure Laterality Date  . NO PAST SURGERIES      No family history on file.  Social History  Substance Use Topics  . Smoking status: Never Smoker  . Smokeless tobacco: Never Used  . Alcohol use No    Allergies: No Known Allergies  Prescriptions Prior to Admission  Medication Sig Dispense Refill Last Dose  . IRON PO Take 1 tablet by mouth 2 (two) times daily.   Past Week at Unknown time  . Prenatal Vit-Fe Fumarate-FA (MULTIVITAMIN-PRENATAL) 27-0.8 MG TABS tablet Take 1 tablet by mouth daily at 12 noon.   Past Week at Unknown time    ROS  Review of Systems - Negative except mild contractions infrequent, leaking of fluid. A comprehensive review of systems was taken and was otherwise negative. Physical Exam   Blood pressure 104/71, pulse 78, temperature 98.1 F (36.7 C), temperature source Oral, resp. rate  18, height 5\' 3"  (1.6 m), weight 130 lb (59 kg), last menstrual period 08/20/2015, SpO2 99 %.  Physical Exam Constitutional: She is oriented to person, place, and time. She appears well-developed and well-nourished.  HEENT: Non-icteric; EOMI; Normocephalic.  Cardiovascular: Normal rate, regular rhythm and normal heart sounds, no murmurs, rubs, gallops.  Pulmonary/Chest: Effort normal and breath sounds normal. CTAB. Abdominal: Soft. Non-tender. Gravid. Neurological: She is alert and oriented to person, place, and time. She has normal reflexes.  Skin: Skin is warm and dry.  Musculoskeletal: No edema. Steady gait.   Psychiatric: She has a normal mood and affect. Her behavior is normal. Judgment and thought content normal.  SVE: 1/50/-4 NST: Reactive- 130 bpm, mod var, +accels, no decels. Contracting q4-57min.   MAU Course  Procedures SSE: No pooling, normal physiologic discharge. No bleeding. Fern: Negative Wet prep:  Neg clue, neg yeast, neg trich, +bacteria Amnisure:  NEGATIVE NST: Reactive. 130 bpm, mod var, +accels, no decels. Contracting q4-61min.  MDM Discussed the results of the tests to the patient and the likelihood of her leaking amniotic fluid is very low. Reviewed labor precautions and kick counts and when to return to emergency care. Patient verbally understands.   Assessment and Plan  20 y/o G1P0 at 48 0/7 weeks here for leaking  fluid.  Normal physiologic discharge, possible leaking of urine.  Labor precautions given, including kick counts, contractions, bleeding, leaking fluid. OK for discharge home, follow up with OB.   Jen Mow, DO OB Fellow 05/12/2016, 10:59 AM

## 2016-05-12 NOTE — MAU Note (Signed)
Pt presents to MAU with complaints of contractions and leakage of fluid. Denies any vaginal bleeding

## 2016-05-12 NOTE — MAU Note (Signed)
Urine in lab 

## 2016-05-15 ENCOUNTER — Inpatient Hospital Stay (HOSPITAL_COMMUNITY)
Admission: AD | Admit: 2016-05-15 | Discharge: 2016-05-15 | Disposition: A | Discharge status: Home / Self Care | Payer: Medicaid Other | Source: Ambulatory Visit | Attending: Obstetrics & Gynecology | Admitting: Obstetrics & Gynecology

## 2016-05-15 ENCOUNTER — Encounter (HOSPITAL_COMMUNITY): Payer: Self-pay | Admitting: *Deleted

## 2016-05-15 DIAGNOSIS — O471 False labor at or after 37 completed weeks of gestation: Secondary | ICD-10-CM | POA: Diagnosis present

## 2016-05-15 DIAGNOSIS — Z3A38 38 weeks gestation of pregnancy: Secondary | ICD-10-CM | POA: Insufficient documentation

## 2016-05-15 LAB — GC/CHLAMYDIA PROBE AMP (~~LOC~~) NOT AT ARMC
CHLAMYDIA, DNA PROBE: NEGATIVE
NEISSERIA GONORRHEA: NEGATIVE

## 2016-05-15 NOTE — MAU Note (Signed)
PT  HAS  ARRIVED  VIA  EMS - SAYS   WAS  HERE ON Friday -  1-2  CM.   SAYS UC  STRONGER  NOW.   PNC-  HD  DENIES HSV AND MRSA.     GBS-POSITIVE

## 2016-05-28 ENCOUNTER — Inpatient Hospital Stay (HOSPITAL_COMMUNITY)
Admission: AD | Admit: 2016-05-28 | Discharge: 2016-05-28 | Disposition: A | Discharge status: Home / Self Care | Payer: Medicaid Other | Source: Ambulatory Visit | Attending: Obstetrics and Gynecology | Admitting: Obstetrics and Gynecology

## 2016-05-28 DIAGNOSIS — N898 Other specified noninflammatory disorders of vagina: Secondary | ICD-10-CM | POA: Diagnosis not present

## 2016-05-28 DIAGNOSIS — O26893 Other specified pregnancy related conditions, third trimester: Secondary | ICD-10-CM | POA: Diagnosis not present

## 2016-05-28 DIAGNOSIS — Z3A40 40 weeks gestation of pregnancy: Secondary | ICD-10-CM | POA: Insufficient documentation

## 2016-05-28 DIAGNOSIS — O4292 Full-term premature rupture of membranes, unspecified as to length of time between rupture and onset of labor: Secondary | ICD-10-CM | POA: Insufficient documentation

## 2016-05-28 LAB — POCT FERN TEST: POCT Fern Test: NEGATIVE

## 2016-05-28 NOTE — MAU Note (Signed)
Pt states ?SROM around 0100-clear fluid. Some irregular contractions. Denies vag bleeding. +FM. Was 2 cm at last exam.

## 2016-05-28 NOTE — Discharge Instructions (Signed)

## 2016-05-28 NOTE — MAU Provider Note (Signed)
None     Chief Complaint:  Rupture of Membranes   Denise PoliceMonica Aranas is  20 y.o. G1P0 at 3084w2d presents complaining of Rupture of Membranes  Patient was walking at 1:00am and felt her underwear soaked. It has since stopped. She saw a lot of white and clear mucus when she went to the bathroom, no continuous drainage and no current leaking.  She states regular, every 3-5 minutes contractions, not severe; associated with none vaginal bleeding, along with active fetal movement.   Obstetrical/Gynecological History: OB History    Gravida Para Term Preterm AB Living   1             SAB TAB Ectopic Multiple Live Births                 Past Medical History: Past Medical History:  Diagnosis Date  . Medical history non-contributory     Past Surgical History: Past Surgical History:  Procedure Laterality Date  . NO PAST SURGERIES      Family History: No family history on file.  Social History: Social History  Substance Use Topics  . Smoking status: Never Smoker  . Smokeless tobacco: Never Used  . Alcohol use No    Allergies: No Known Allergies  Meds:  Prescriptions Prior to Admission  Medication Sig Dispense Refill Last Dose  . IRON PO Take 1 tablet by mouth 2 (two) times daily.   05/14/2016 at Unknown time  . Prenatal Vit-Fe Fumarate-FA (MULTIVITAMIN-PRENATAL) 27-0.8 MG TABS tablet Take 1 tablet by mouth daily at 12 noon.   05/14/2016 at Unknown time    Review of Systems   Constitutional: Negative for fever and chills Eyes: Negative for visual disturbances Respiratory: Negative for shortness of breath, dyspnea Cardiovascular: Negative for chest pain or palpitations  Gastrointestinal: Negative for vomiting, diarrhea and constipation Genitourinary: Negative for dysuria and urgency Musculoskeletal: Negative for back pain, joint pain, myalgias.  Normal ROM  Neurological: Negative for dizziness and headaches    Physical Exam  Blood pressure 100/68, pulse 64, temperature  98.2 F (36.8 C), temperature source Oral, resp. rate 18, height 5\' 3"  (1.6 m), weight 59 kg (130 lb), last menstrual period 08/20/2015, SpO2 98 %. GENERAL: Well-developed, well-nourished female in no acute distress.  CERVICAL EXAM: No pooling with or without coughing, white mucous noted, cervix normal   FHT:  Baseline rate 120 bpm   Variability moderate  Accelerations present   Decelerations none Contractions: Every 5 mins   Labs: Results for orders placed or performed during the hospital encounter of 05/28/16 (from the past 24 hour(s))  The [employer]ern Test   Collection Time: 05/28/16  2:40 AM  Result Value Ref Range   POCT Fern Test Negative = intact amniotic membranes    Imaging Studies:  [place] Mfm Ob Limited  Result Date: 05/10/2016 OBSTETRICAL ULTRASOUND: This exam was performed within a [place] [facility]. The OB US report was generated in the [facility] system, and faxed to the ordering physician.  This report is available in the [employer] PACS. See the [facility] Obstetric US report via the Image Link.   Assessment: Denise Castro is  20 y.o. G1P0 at 2284w2d presents with  ROM.  Plan: Negative fern exam  Cervical exam showing white mucous, no pooling  Follow up with OB on Monday  Counseled on contractions, vaginal bleeding and fetal movements   Asiyah Z Mikell 8/20/20173:10 AM   I spoke with and examined patient and agree with resident/PA/SNM's note and plan of care.  Cat I FHR, mild uc's, SVE 1.5/50/-1, vtx by RN SSE: cx visually closed, no pooling/no change w/ valsalva, fern neg, only nonodorous mucousy d/c noted.  Reviewed labor s/s, fkc, reasons [facility] Cheral MarkerKimberly R. Booker, CNM, [facility]NP-BC 05/28/2016 3:54 AM

## 2016-05-29 ENCOUNTER — Ambulatory Visit (HOSPITAL_COMMUNITY): Payer: Medicaid Other

## 2016-05-29 ENCOUNTER — Inpatient Hospital Stay (HOSPITAL_COMMUNITY): Payer: Medicaid Other

## 2016-05-29 ENCOUNTER — Inpatient Hospital Stay (HOSPITAL_COMMUNITY)
Admission: AD | Admit: 2016-05-29 | Discharge: 2016-05-29 | Disposition: A | Discharge status: Home / Self Care | Payer: Medicaid Other | Source: Ambulatory Visit | Attending: Family Medicine | Admitting: Family Medicine

## 2016-05-29 ENCOUNTER — Other Ambulatory Visit: Payer: Self-pay | Admitting: Family Medicine

## 2016-05-29 ENCOUNTER — Encounter (HOSPITAL_COMMUNITY): Payer: Self-pay

## 2016-05-29 DIAGNOSIS — O288 Other abnormal findings on antenatal screening of mother: Secondary | ICD-10-CM

## 2016-05-29 DIAGNOSIS — Z3A40 40 weeks gestation of pregnancy: Secondary | ICD-10-CM | POA: Insufficient documentation

## 2016-05-29 DIAGNOSIS — Z3493 Encounter for supervision of normal pregnancy, unspecified, third trimester: Secondary | ICD-10-CM | POA: Diagnosis not present

## 2016-05-29 DIAGNOSIS — N898 Other specified noninflammatory disorders of vagina: Secondary | ICD-10-CM | POA: Diagnosis not present

## 2016-05-29 DIAGNOSIS — O26893 Other specified pregnancy related conditions, third trimester: Secondary | ICD-10-CM | POA: Diagnosis not present

## 2016-05-29 NOTE — MAU Note (Signed)
[facility], states fetal HR was high & she was having contractions.  Denies bleeding or LOF.  Is to have BPP according to pt.

## 2016-05-29 NOTE — MAU Provider Note (Signed)
History     CSN: [id number]  Arrival date and time: 05/29/16 1125   First Provider Initiated Contact with Patient 05/29/16 1534      Chief Complaint  Patient presents with  . bpp   Stephani PoliceMonica Dente is a 20yo G1P0 at 40.3 sent to MAU from clinic. [facility] and had high fetal heart rate around 170s after drinking juice. Endorses irreg ctx that come and go. No LOF/bleeding. Good FM. Some mucus discharge that is minimal. Feels well overall without complaints.    OB History    Gravida Para Term Preterm AB Living   1             SAB TAB Ectopic Multiple Live Births                  Past Medical History:  Diagnosis Date  . Medical history non-contributory     Past Surgical History:  Procedure Laterality Date  . NO PAST SURGERIES      History reviewed. No pertinent family history.  Social History  Substance Use Topics  . Smoking status: Never Smoker  . Smokeless tobacco: Never Used  . Alcohol use No    Allergies: No Known Allergies  Prescriptions Prior to Admission  Medication Sig Dispense Refill Last Dose  . IRON PO Take 1 tablet by mouth 2 (two) times daily.   05/28/2016 at Unknown time  . Prenatal Vit-Fe Fumarate-FA (MULTIVITAMIN-PRENATAL) 27-0.8 MG TABS tablet Take 1 tablet by mouth daily at 12 noon.   05/28/2016 at Unknown time    Review of Systems  Eyes: Negative for blurred vision and double vision.  Respiratory: Negative for shortness of breath.   Cardiovascular: Negative for chest pain and leg swelling.  Gastrointestinal: Negative for abdominal pain.  Neurological: Negative for headaches.   Physical Exam   Blood pressure 95/63, pulse 107, temperature 98.4 F (36.9 C), temperature source Oral, resp. rate 16, last menstrual period 08/20/2015.  Physical Exam  Constitutional: She is oriented to person, place, and time. She appears well-developed and well-nourished. No distress.  HENT:  Head: Normocephalic and atraumatic.  Eyes: EOM  are normal.  Cardiovascular: Normal rate, regular rhythm and normal heart sounds.  Exam reveals no gallop and no friction rub.   No murmur heard. Respiratory: Effort normal and breath sounds normal. No respiratory distress. She has no wheezes.  GI: Soft. Bowel sounds are normal. There is no tenderness.  gravid  Musculoskeletal: She exhibits no edema.  Neurological: She is alert and oriented to person, place, and time. She has normal reflexes.  Skin: Skin is warm and dry.  Psychiatric: She has a normal mood and affect.    MAU Course  Procedures none  MDM - BPP by MFM 8/8 - NST reactive in MAU  Assessment and Plan  Nonreactive stress test in outpatient setting, resolved. - Discharged home given BPP 8/8 by MFM and reactive NST in MAU - given return precautions and labor precautions - follow up as outpatient in clinic  Leland HerElsia J Yoo PGY-1 05/29/2016, 3:39 PM   Midwife attestation:  I have seen and examined this patient; I agree with above documentation in the resident's note.   Stephani PoliceMonica Weidner is a 20 y.o. G1P0 with fetal tachycardia in office. +FM, denies LOF, VB, contractions, vaginal discharge.  PE: BP 102/66 (BP Location: Left Arm)   Pulse 70   Temp 98.4 F (36.9 C) (Oral)   Resp 16   LMP 08/20/2015  Gen: calm  comfortable, NAD Resp: normal effort, no distress Abd: gravid  ROS, labs, PMH reviewed NST reactive with baseline 135 in MAU BPP 8/8 with 10/10 including NST  A/P: Follow up as scheduled  LEFTWICH-KIRBY, Trayonna Bachmeier, CNM  3:14 PM

## 2016-06-01 ENCOUNTER — Encounter (HOSPITAL_COMMUNITY): Payer: Self-pay | Admitting: *Deleted

## 2016-06-01 ENCOUNTER — Telehealth (HOSPITAL_COMMUNITY): Payer: Self-pay | Admitting: *Deleted

## 2016-06-01 NOTE — Telephone Encounter (Signed)
Preadmission screen  

## 2016-06-03 ENCOUNTER — Inpatient Hospital Stay (HOSPITAL_COMMUNITY)
Admission: RE | Admit: 2016-06-03 | Discharge: 2016-06-06 | DRG: 775 | Disposition: A | Discharge status: Home / Self Care | Payer: Medicaid Other | Source: Ambulatory Visit | Attending: Obstetrics & Gynecology | Admitting: Obstetrics & Gynecology

## 2016-06-03 ENCOUNTER — Inpatient Hospital Stay (HOSPITAL_COMMUNITY): Payer: Medicaid Other | Admitting: Anesthesiology

## 2016-06-03 ENCOUNTER — Encounter (HOSPITAL_COMMUNITY): Payer: Self-pay

## 2016-06-03 DIAGNOSIS — O99824 Streptococcus B carrier state complicating childbirth: Secondary | ICD-10-CM | POA: Diagnosis present

## 2016-06-03 DIAGNOSIS — O326XX0 Maternal care for compound presentation, not applicable or unspecified: Secondary | ICD-10-CM | POA: Diagnosis present

## 2016-06-03 DIAGNOSIS — O480 Post-term pregnancy: Principal | ICD-10-CM | POA: Diagnosis present

## 2016-06-03 DIAGNOSIS — Z3A41 41 weeks gestation of pregnancy: Secondary | ICD-10-CM | POA: Diagnosis not present

## 2016-06-03 LAB — CBC
HEMATOCRIT: 29.7 % — AB (ref 36.0–46.0)
HEMOGLOBIN: 9.9 g/dL — AB (ref 12.0–15.0)
MCH: 27.8 pg (ref 26.0–34.0)
MCHC: 33.3 g/dL (ref 30.0–36.0)
MCV: 83.4 fL (ref 78.0–100.0)
PLATELETS: 289 10*3/uL (ref 150–400)
RBC: 3.56 MIL/uL — ABNORMAL LOW (ref 3.87–5.11)
RDW: 14.2 % (ref 11.5–15.5)
WBC: 7 10*3/uL (ref 4.0–10.5)

## 2016-06-03 LAB — ABO/RH: ABO/RH(D): A POS

## 2016-06-03 LAB — TYPE AND SCREEN
ABO/RH(D): A POS
Antibody Screen: NEGATIVE

## 2016-06-03 MED ORDER — PHENYLEPHRINE 40 MCG/ML (10ML) SYRINGE FOR IV PUSH (FOR BLOOD PRESSURE SUPPORT)
80.0000 ug | PREFILLED_SYRINGE | INTRAVENOUS | Status: DC | PRN
  Filled 2016-06-03: qty 10
  Filled 2016-06-03: qty 5

## 2016-06-03 MED ORDER — OXYTOCIN BOLUS FROM INFUSION: 500.0000 mL | Freq: Once | INTRAVENOUS | Status: DC

## 2016-06-03 MED ORDER — FENTANYL 2.5 MCG/ML BUPIVACAINE 1/10 % EPIDURAL INFUSION (WH - ANES)
14.0000 mL/h | INTRAMUSCULAR | Status: DC | PRN
  Administered 2016-06-03 (×2): 14 mL/h via EPIDURAL
  Filled 2016-06-03: qty 125

## 2016-06-03 MED ORDER — LIDOCAINE HCL (PF) 1 % IJ SOLN
30.0000 mL | INTRAMUSCULAR | Status: DC | PRN
  Filled 2016-06-03: qty 30

## 2016-06-03 MED ORDER — LIDOCAINE HCL (PF) 1 % IJ SOLN
INTRAMUSCULAR | Status: DC | PRN
  Administered 2016-06-03 (×2): 5 mL

## 2016-06-03 MED ORDER — OXYTOCIN 40 UNITS IN LACTATED RINGERS INFUSION - SIMPLE MED
2.5000 [IU]/h | INTRAVENOUS | Status: DC
  Administered 2016-06-04: 2.5 [IU]/h via INTRAVENOUS

## 2016-06-03 MED ORDER — OXYTOCIN 40 UNITS IN LACTATED RINGERS INFUSION - SIMPLE MED
1.0000 m[IU]/min | INTRAVENOUS | Status: DC
  Administered 2016-06-03: 2 m[IU]/min via INTRAVENOUS
  Filled 2016-06-03: qty 1000

## 2016-06-03 MED ORDER — EPHEDRINE 5 MG/ML INJ
10.0000 mg | INTRAVENOUS | Status: DC | PRN
  Filled 2016-06-03: qty 4

## 2016-06-03 MED ORDER — OXYCODONE-ACETAMINOPHEN 5-325 MG PO TABS: 2.0000 | ORAL_TABLET | ORAL | Status: DC | PRN

## 2016-06-03 MED ORDER — LACTATED RINGERS IV SOLN: 500.0000 mL | Freq: Once | INTRAVENOUS | Status: DC

## 2016-06-03 MED ORDER — LACTATED RINGERS IV SOLN
500.0000 mL | INTRAVENOUS | Status: DC | PRN
  Administered 2016-06-03: 500 mL via INTRAVENOUS
  Administered 2016-06-03: 1000 mL via INTRAVENOUS

## 2016-06-03 MED ORDER — PROMETHAZINE HCL 25 MG/ML IJ SOLN
12.5000 mg | Freq: Once | INTRAMUSCULAR | Status: AC
  Administered 2016-06-03: 12.5 mg via INTRAVENOUS
  Filled 2016-06-03: qty 1

## 2016-06-03 MED ORDER — FLEET ENEMA 7-19 GM/118ML RE ENEM: 1.0000 | ENEMA | Freq: Every day | RECTAL | Status: DC | PRN

## 2016-06-03 MED ORDER — ACETAMINOPHEN 325 MG PO TABS: 650.0000 mg | ORAL_TABLET | ORAL | Status: DC | PRN

## 2016-06-03 MED ORDER — LACTATED RINGERS IV SOLN
INTRAVENOUS | Status: DC
  Administered 2016-06-03 – 2016-06-04 (×3): via INTRAVENOUS

## 2016-06-03 MED ORDER — OXYCODONE-ACETAMINOPHEN 5-325 MG PO TABS: 1.0000 | ORAL_TABLET | ORAL | Status: DC | PRN

## 2016-06-03 MED ORDER — SOD CITRATE-CITRIC ACID 500-334 MG/5ML PO SOLN: 30.0000 mL | ORAL | Status: DC | PRN

## 2016-06-03 MED ORDER — TERBUTALINE SULFATE 1 MG/ML IJ SOLN
0.2500 mg | Freq: Once | INTRAMUSCULAR | Status: DC | PRN
  Filled 2016-06-03: qty 1

## 2016-06-03 MED ORDER — FENTANYL CITRATE (PF) 100 MCG/2ML IJ SOLN
100.0000 ug | INTRAMUSCULAR | Status: DC | PRN
  Administered 2016-06-03 (×3): 100 ug via INTRAVENOUS
  Filled 2016-06-03 (×3): qty 2

## 2016-06-03 MED ORDER — PENICILLIN G POTASSIUM 5000000 UNITS IJ SOLR
2.5000 10*6.[IU] | INTRAVENOUS | Status: DC
  Administered 2016-06-03 – 2016-06-04 (×4): 2.5 10*6.[IU] via INTRAVENOUS
  Filled 2016-06-03 (×9): qty 2.5

## 2016-06-03 MED ORDER — DIPHENHYDRAMINE HCL 50 MG/ML IJ SOLN: 12.5000 mg | INTRAMUSCULAR | Status: DC | PRN

## 2016-06-03 MED ORDER — MISOPROSTOL 25 MCG QUARTER TABLET
25.0000 ug | ORAL_TABLET | ORAL | Status: DC | PRN
  Filled 2016-06-03: qty 1

## 2016-06-03 MED ORDER — ONDANSETRON HCL 4 MG/2ML IJ SOLN: 4.0000 mg | Freq: Four times a day (QID) | INTRAMUSCULAR | Status: DC | PRN

## 2016-06-03 MED ORDER — PHENYLEPHRINE 40 MCG/ML (10ML) SYRINGE FOR IV PUSH (FOR BLOOD PRESSURE SUPPORT)
80.0000 ug | PREFILLED_SYRINGE | INTRAVENOUS | Status: DC | PRN
  Filled 2016-06-03: qty 5
  Filled 2016-06-03: qty 10

## 2016-06-03 MED ORDER — PENICILLIN G POTASSIUM 5000000 UNITS IJ SOLR
5.0000 10*6.[IU] | Freq: Once | INTRAVENOUS | Status: AC
  Administered 2016-06-03: 5 10*6.[IU] via INTRAVENOUS
  Filled 2016-06-03: qty 5

## 2016-06-03 MED ORDER — BUTORPHANOL TARTRATE 1 MG/ML IJ SOLN
2.0000 mg | Freq: Once | INTRAMUSCULAR | Status: AC
  Administered 2016-06-03: 2 mg via INTRAVENOUS
  Filled 2016-06-03: qty 2

## 2016-06-03 NOTE — Progress Notes (Signed)
AROM performed at 18:25 with large volume of clear fluid. Cervical exam: 7/80/-2 with head well applied to cervix. FHT reassuring.   Marcy Sirenatherine Wallace, D.O. 06/03/2016, 6:31 PM PGY-2, [facility]

## 2016-06-03 NOTE — Anesthesia Pain Management Evaluation Note (Signed)
  CRNA Pain Management Visit Note  Patient: Denise Castro, 20 y.o., female  "Hello I am a member of the anesthesia team at [facility]. We have an anesthesia team available at all times to provide care throughout the hospital, including epidural management and anesthesia for C-section. I don't know your plan for the delivery whether it a natural birth, water birth, IV sedation, nitrous supplementation, doula or epidural, but we want to meet your pain goals."   1.Was your pain managed to your expectations on prior hospitalizations?   No   2.What is your expectation for pain management during this hospitalization?     IV pain meds  3.How can we help you reach that goal? Assist as needed  Record the patient's initial score and the patient's pain goal.   Pain: 2  Pain Goal: 10 [facility] wants you to be able to say your pain was always managed very well.  [facility],Denise Castro 06/03/2016

## 2016-06-03 NOTE — Anesthesia Procedure Notes (Signed)
Epidural Patient location during procedure: OB  Staffing Anesthesiologist: Felcia Huebert Performed: anesthesiologist   Preanesthetic Checklist Completed: patient identified, site marked, surgical consent, pre-op evaluation, timeout performed, IV checked, risks and benefits discussed and monitors and equipment checked  Epidural Patient position: sitting Prep: DuraPrep Patient monitoring: heart rate, continuous pulse ox and blood pressure Approach: right paramedian Location: L4-L5 Injection technique: LOR saline  Needle:  Needle type: Tuohy  Needle gauge: 17 G Needle length: 9 cm and 9 Needle insertion depth: 5 cm Catheter type: closed end flexible Catheter size: 20 Guage Catheter at skin depth: 10 cm Test dose: negative  Assessment Events: blood not aspirated, injection not painful, no injection resistance, negative IV test and no paresthesia  Additional Notes Patient identified. Risks/Benefits/Options discussed with patient including but not limited to bleeding, infection, nerve damage, paralysis, failed block, incomplete pain control, headache, blood pressure changes, nausea, vomiting, reactions to medication both or allergic, itching and postpartum back pain. Confirmed with bedside nurse the patient's most recent platelet count. Confirmed with patient that they are not currently taking any anticoagulation, have any bleeding history or any family history of bleeding disorders. Patient expressed understanding and wished to proceed. All questions were answered. Sterile technique was used throughout the entire procedure. Please see nursing notes for vital signs. Test dose was given through epidural needle and negative prior to continuing to dose epidural or start infusion. Warning signs of high block given to the patient including shortness of breath, tingling/numbness in hands, complete motor block, or any concerning symptoms with instructions to call for help. Patient was given  instructions on fall risk and not to get out of bed. All questions and concerns addressed with instructions to call with any issues.     

## 2016-06-03 NOTE — Anesthesia Preprocedure Evaluation (Signed)

## 2016-06-03 NOTE — Progress Notes (Signed)
Denise Castro is a 20 y.o. G2P0010 at 6643w1d by LMP admitted for induction of labor due to Post dates.  Subjective: Pt breathing though contractions, c/o pressure with contractions  Objective: BP 108/68   Pulse 84   Temp 98.6 F (37 C) (Oral)   Resp 18   Ht 5\' 3"  (1.6 m)   Wt 59.9 kg (132 lb)   LMP 08/20/2015   BMI 23.38 kg/m  No intake/output data recorded. No intake/output data recorded.  FHT:  FHR: 140 bpm, variability: moderate,  accelerations:  Present,  decelerations:  Present Occasional variables and early UC:   regular, every 2-3 minutes SVE:   Dilation: 7 Effacement (%): 80 Station: -2 Exam by:: erin davis rn  Labs: Lab Results  Component Value Date   WBC 7.0 06/03/2016   HGB 9.9 (L) 06/03/2016   HCT 29.7 (L) 06/03/2016   MCV 83.4 06/03/2016   PLT 289 06/03/2016    Assessment / Plan: Induction of labor due to postterm,  progressing well on pitocin  Labor: Progressing slowly in active stage on Pitocin, will continue to increase as needed Fetal Wellbeing:  Category II Pain Control:  IV pain meds I/D:  n/a Anticipated MOD:  NSVD  Denise Castro 06/03/2016, 9:32 PM

## 2016-06-03 NOTE — H&P (Signed)
LABOR ADMISSION HISTORY AND PHYSICAL  Stephani PoliceMonica Tippen is a 20 y.o. female G2P0010 with IUP at 7061w1d by LMP c/w 19 wk sono presenting for IOL 2/2 to postdates. She reports +FMs, No LOF, no VB, no blurry vision, headaches or peripheral edema, and RUQ pain.  She plans on breast & bottle feeding. She request condoms for birth control.  Dating: By LMP c/w 19 wk sono --->  Estimated Date of Delivery: 05/26/16  Prenatal History/Complications:  Past Medical History: Past Medical History:  Diagnosis Date  . Medical history non-contributory     Past Surgical History: Past Surgical History:  Procedure Laterality Date  . NO PAST SURGERIES      Obstetrical History: OB History    Gravida Para Term Preterm AB Living   2       1     SAB TAB Ectopic Multiple Live Births     1            Social History: Social History   Social History  . Marital status: Single    Spouse name: N/A  . Number of children: N/A  . Years of education: N/A   Social History Main Topics  . Smoking status: Never Smoker  . Smokeless tobacco: Never Used  . Alcohol use No  . Drug use: No  . Sexual activity: Yes    Birth control/ protection: None   Other Topics Concern  . None   Social History Narrative  . None    Family History: Family History  Problem Relation Age of Onset  . Alcohol abuse Neg Hx   . Arthritis Neg Hx   . Asthma Neg Hx   . Birth defects Neg Hx   . Cancer Neg Hx   . COPD Neg Hx   . Depression Neg Hx   . Diabetes Neg Hx   . Drug abuse Neg Hx   . Early death Neg Hx   . Hearing loss Neg Hx   . Heart disease Neg Hx   . Hyperlipidemia Neg Hx   . Hypertension Neg Hx   . Kidney disease Neg Hx   . Learning disabilities Neg Hx   . Mental illness Neg Hx   . Mental retardation Neg Hx   . Miscarriages / Stillbirths Neg Hx   . Stroke Neg Hx   . Vision loss Neg Hx   . Varicose Veins Neg Hx     Allergies: No Known Allergies  Prescriptions Prior to Admission  Medication Sig Dispense  Refill Last Dose  . ferrous sulfate 325 (65 FE) MG tablet Take 325 mg by mouth 2 (two) times daily with a meal.   Past Week at Unknown time  . Prenatal Vit-Fe Fumarate-FA (MULTIVITAMIN-PRENATAL) 27-0.8 MG TABS tablet Take 1 tablet by mouth daily.    06/03/2016 at Unknown time     Review of Systems   All systems reviewed and negative except as stated in HPI  Temperature 98.2 F (36.8 C), temperature source Oral, resp. rate 20, height 5\' 3"  (1.6 m), weight 59.9 kg (132 lb), last menstrual period 08/20/2015. General appearance: alert and cooperative Lungs: clear to auscultation bilaterally Heart: regular rate and rhythm Abdomen: soft, non-tender; bowel sounds normal Extremities: Homans sign is negative, no sign of DVT Presentation: cephalic Fetal monitoringBaseline: 150 bpm, Variability: Good {> 6 bpm), Accelerations: Reactive and Decelerations: Variable: mild Uterine activityFrequency: Every 2-6 minutes Dilation: 1.5 Effacement (%): 70 Station: -2 Exam by:: Dr. Genevie AnnSchenk   Prenatal labs: ABO, Rh: --/--/A POS (  08/26 0802) Antibody: NEG (08/26 0802) Rubella: !Error! RPR: Nonreactive, Nonreactive (03/02 0000)  HBsAg: Negative (03/02 0000)  HIV: Non-reactive, Other (03/02 0000)  GBS: Positive (07/24 0000)  1 hr Glucola 155; 3hr glucola: 81/150/126/120 Genetic screening: Normal quad  Anatomy US Normal   Prenatal Transfer Tool  Maternal Diabetes: No Genetic Screening: Normal Maternal Ultrasounds/Referrals: Normal Fetal Ultrasounds or other Referrals:  None Maternal Substance Abuse:  No Significant Maternal Medications:  None Significant Maternal Lab Results: Lab values include: Group B Strep positive  Results for orders placed or performed during the hospital encounter of 06/03/16 (from the past 24 hour(s))  CBC   Collection Time: 06/03/16  8:02 AM  Result Value Ref Range   WBC 7.0 4.0 - 10.5 K/uL   RBC 3.56 (L) 3.87 - 5.11 MIL/uL   Hemoglobin 9.9 (L) 12.0 - 15.0 g/dL   HCT  16.1 (L) 09.6 - 46.0 %   MCV 83.4 78.0 - 100.0 fL   MCH 27.8 26.0 - 34.0 pg   MCHC 33.3 30.0 - 36.0 g/dL   RDW 04.5 40.9 - 81.1 %   Platelets 289 150 - 400 K/uL  Type and screen [facility]   Collection Time: 06/03/16  8:02 AM  Result Value Ref Range   ABO/RH(D) A POS    Antibody Screen NEG    Sample Expiration 06/06/2016     Patient Active Problem List   Diagnosis Date Noted  . Post-dates pregnancy 06/03/2016    Assessment: Ahlaya Ende is a 20 y.o. G2P0010 at [redacted]w[redacted]d here for IOL postdates.   #Labor:Foley bulb, nitrous oxide #Pain: Epidural upon request  #FWB: Cat I  #ID:  GBS Positive >> PCN  #MOF: Breast and bottle  #MOC:Condoms  #Circ:  Outpatient   De Hollingshead 06/03/2016, 10:40 AM

## 2016-06-04 ENCOUNTER — Encounter (HOSPITAL_COMMUNITY): Payer: Self-pay

## 2016-06-04 DIAGNOSIS — Z3A41 41 weeks gestation of pregnancy: Secondary | ICD-10-CM

## 2016-06-04 DIAGNOSIS — O480 Post-term pregnancy: Secondary | ICD-10-CM

## 2016-06-04 DIAGNOSIS — O99824 Streptococcus B carrier state complicating childbirth: Secondary | ICD-10-CM

## 2016-06-04 LAB — CBC
HEMATOCRIT: 29 % — AB (ref 36.0–46.0)
HEMOGLOBIN: 9.6 g/dL — AB (ref 12.0–15.0)
MCH: 27 pg (ref 26.0–34.0)
MCHC: 33.1 g/dL (ref 30.0–36.0)
MCV: 81.5 fL (ref 78.0–100.0)
Platelets: 264 10*3/uL (ref 150–400)
RBC: 3.56 MIL/uL — ABNORMAL LOW (ref 3.87–5.11)
RDW: 14.7 % (ref 11.5–15.5)
WBC: 15.5 10*3/uL — ABNORMAL HIGH (ref 4.0–10.5)

## 2016-06-04 LAB — RPR: RPR: NONREACTIVE

## 2016-06-04 MED ORDER — DIPHENHYDRAMINE HCL 25 MG PO CAPS: 25.0000 mg | ORAL_CAPSULE | Freq: Four times a day (QID) | ORAL | Status: DC | PRN

## 2016-06-04 MED ORDER — WITCH HAZEL-GLYCERIN EX PADS: 1.0000 "application " | MEDICATED_PAD | CUTANEOUS | Status: DC | PRN

## 2016-06-04 MED ORDER — SIMETHICONE 80 MG PO CHEW: 80.0000 mg | CHEWABLE_TABLET | ORAL | Status: DC | PRN

## 2016-06-04 MED ORDER — ONDANSETRON HCL 4 MG PO TABS: 4.0000 mg | ORAL_TABLET | ORAL | Status: DC | PRN

## 2016-06-04 MED ORDER — TETANUS-DIPHTH-ACELL PERTUSSIS 5-2.5-18.5 LF-MCG/0.5 IM SUSP: 0.5000 mL | Freq: Once | INTRAMUSCULAR | Status: DC

## 2016-06-04 MED ORDER — SENNOSIDES-DOCUSATE SODIUM 8.6-50 MG PO TABS
2.0000 | ORAL_TABLET | ORAL | Status: DC
  Administered 2016-06-04 – 2016-06-06 (×2): 2 via ORAL
  Filled 2016-06-04 (×2): qty 2

## 2016-06-04 MED ORDER — BENZOCAINE-MENTHOL 20-0.5 % EX AERO: 1.0000 "application " | INHALATION_SPRAY | CUTANEOUS | Status: DC | PRN

## 2016-06-04 MED ORDER — IBUPROFEN 600 MG PO TABS
600.0000 mg | ORAL_TABLET | Freq: Four times a day (QID) | ORAL | Status: DC
  Administered 2016-06-04 – 2016-06-06 (×10): 600 mg via ORAL
  Filled 2016-06-04 (×10): qty 1

## 2016-06-04 MED ORDER — COCONUT OIL OIL
1.0000 "application " | TOPICAL_OIL | Status: DC | PRN
  Administered 2016-06-05: 1 via TOPICAL
  Filled 2016-06-04: qty 120

## 2016-06-04 MED ORDER — PRENATAL MULTIVITAMIN CH
1.0000 | ORAL_TABLET | Freq: Every day | ORAL | Status: DC
  Administered 2016-06-04 – 2016-06-06 (×3): 1 via ORAL
  Filled 2016-06-04 (×3): qty 1

## 2016-06-04 MED ORDER — ONDANSETRON HCL 4 MG/2ML IJ SOLN
4.0000 mg | INTRAMUSCULAR | Status: DC | PRN
Start: 2016-06-04 — End: 2016-06-06

## 2016-06-04 MED ORDER — ACETAMINOPHEN 325 MG PO TABS: 650.0000 mg | ORAL_TABLET | ORAL | Status: DC | PRN

## 2016-06-04 MED ORDER — ZOLPIDEM TARTRATE 5 MG PO TABS: 5.0000 mg | ORAL_TABLET | Freq: Every evening | ORAL | Status: DC | PRN

## 2016-06-04 MED ORDER — DIBUCAINE 1 % RE OINT: 1.0000 "application " | TOPICAL_OINTMENT | RECTAL | Status: DC | PRN

## 2016-06-04 NOTE — Progress Notes (Signed)
Patient ID: Stephani PoliceMonica Plocher, female   DOB: 1995/10/31, 20 y.o.   MRN: [id number] Stephani PoliceMonica Larranaga is a 20 y.o. G2P0010 at 5377w2d.  Subjective: Comfortable w/ epidural.  Objective: BP 100/62   Pulse 74   Temp 98.9 F (37.2 C) (Oral)   Resp 18   Ht 5\' 3"  (1.6 m)   Wt 132 lb (59.9 kg)   LMP 08/20/2015   SpO2 99%   BMI 23.38 kg/m    FHT:  FHR: 145 bpm, variability: mod,  accelerations:  15x15,  decelerations:  Mild varibales UC:   Q 2-4 minutes, strong  Dilation: Lip/rim Effacement (%): 100 Cervical Position: Posterior Station: +1 Presentation: Vertex Exam by:: [employer]ginia Rosary Filosa  Labs: NA  Assessment / Plan: 4777w2d week IUP Labor: transition  Fetal Wellbeing:  Category I Pain Control:  Epidural  Anticipated MOD:  SVD  [place] Zenas [place], CNM 06/04/2016 1:41 AM

## 2016-06-04 NOTE — Lactation Note (Signed)
This note was copied from a baby's chart. Lactation Consultation Note  Patient Name: Denise Castro MWUXL'KToday's Date: 06/04/2016 Reason for consult: Initial assessment Baby at 18hr of life. Mom reports baby is latching well. She denies breast or nipple pain. She is concerned because baby has been sleepy today. Discussed baby behavior, feeding frequency, baby belly size, voids, wt loss, breast changes, and nipple care. Mom stated she can manually express and has spoon in room. Given lactation handouts. Aware of OP services and support group.    Maternal Data Has patient been taught Hand Expression?: Yes Does the patient have breastfeeding experience prior to this delivery?: No  Feeding    LATCH Score/Interventions                      Lactation Tools Discussed/Used WIC Program: Yes   Consult Status Consult Status: Follow-up Date: 06/05/16 Follow-up type: In-patient    Denise Castro 06/04/2016, 10:11 PM

## 2016-06-04 NOTE — Anesthesia Postprocedure Evaluation (Signed)
Anesthesia Post Note  Patient: Denise Castro  Procedure(s) Performed: * No procedures listed *  Patient location during evaluation: Mother Baby Anesthesia Type: Epidural Level of consciousness: awake Pain management: satisfactory to patient Vital Signs Assessment: post-procedure vital signs reviewed and stable Respiratory status: spontaneous breathing Cardiovascular status: stable Anesthetic complications: no     Last Vitals:  Vitals:   06/04/16 0650 06/04/16 1034  BP: 104/66 (!) 100/58  Pulse: 82 60  Resp: 18 20  Temp: 37 C 36.8 C    Last Pain:  Vitals:   06/04/16 1034  TempSrc: Oral  PainSc:    Pain Goal: Patients Stated Pain Goal: 8 (06/03/16 0739)               Cephus ShellingBURGER,Ellena Kamen

## 2016-06-05 NOTE — Lactation Note (Signed)
This note was copied from a baby's chart. Lactation Consultation Note  Patient Name: Denise Stephani PoliceMonica Besse ZOXWR'UToday's Date: 06/05/2016  Mom states the baby has been cluster feeding.  Reassured this is normal and frequent feedings will assist in establishing good milk supply.  Baby is sleeping on mom's chest.  Encouraged to watch for feeding cues and to call for assist prn.   Maternal Data    Feeding Length of feed: 30 min  LATCH Score/Interventions                      Lactation Tools Discussed/Used     Consult Status      Huston FoleyMOULDEN, Raelie Lohr S 06/05/2016, 2:42 PM

## 2016-06-05 NOTE — Discharge Instructions (Signed)

## 2016-06-05 NOTE — Progress Notes (Signed)
LCSW received consult for MOB: Hx of depression. Attempted to meet with MOB, however she was breastfeeding and LCSW did not disturb. Chart was reviewed and currently no stressors or needs for immediate response.  LCSW will follow up later today, or early on Tuesday morning to complete assessment and evaluate any current needs.  Deretha EmoryHannah Pheng Prokop LCSW, MSW Clinical Social Work: System Insurance underwriterWide Float Coverage for [employer] NICU Clinical social worker [phone]

## 2016-06-05 NOTE — Progress Notes (Signed)
Post Partum Day 1 Subjective: no complaints, up ad lib, voiding, tolerating PO and + flatus  Objective: Blood pressure 104/76, pulse 64, temperature 98 F (36.7 C), temperature source Oral, resp. rate 16, height 5\' 3"  (1.6 m), weight 59.9 kg (132 lb), last menstrual period 08/20/2015, SpO2 98 %, unknown if currently breastfeeding.  Physical Exam:  General: alert, cooperative and no distress Lochia: appropriate Uterine Fundus: firm DVT Evaluation: No evidence of DVT seen on physical exam. Negative Homan's sign.   Recent Labs  06/03/16 0802 06/04/16 0624  HGB 9.9* 9.6*  HCT 29.7* 29.0*    Assessment/Plan: Plan for discharge tomorrow vs later today.   LOS: 2 days   Leland HerElsia J Yoo PGY-1 06/05/2016, 7:58 AM   OB FELLOW POSTPARTUM PROGRESS NOTE ATTESTATION  I have seen and examined this patient and agree with above documentation in the resident's note.   Ernestina PennaNicholas Schenk, MD 9:24 AM

## 2016-06-06 MED ORDER — SENNOSIDES-DOCUSATE SODIUM 8.6-50 MG PO TABS: 2.0000 | ORAL_TABLET | ORAL | 0 refills | Status: AC

## 2016-06-06 MED ORDER — IBUPROFEN 600 MG PO TABS: 600.0000 mg | ORAL_TABLET | Freq: Four times a day (QID) | ORAL | 1 refills | Status: AC

## 2016-06-06 NOTE — Discharge Summary (Signed)
OB Discharge Summary     Patient Name: Denise Castro DOB: Apr 27, 1996 MRN: [id number]  Date of admission: 06/03/2016 Delivering MD: Dorathy Kinsman   Date of discharge: 06/06/2016  Admitting diagnosis: INDUCTION  Intrauterine pregnancy: [redacted]w[redacted]d     Secondary diagnosis:  Active Problems:   Post-dates pregnancy   SVD (spontaneous vaginal delivery)  Additional problems: None      Discharge diagnosis: Term Pregnancy Delivered                                                                                                Post partum procedures:None  Augmentation: Pitocin and Foley Balloon  Complications: None  Hospital course:  Onset of Labor With Vaginal Delivery     20 y.o. yo G2P1011 at [redacted]w[redacted]d was admitted in Active Labor on 06/03/2016. Patient had an uncomplicated labor course as follows:  Membrane Rupture Time/Date: 6:26 PM ,06/03/2016   Intrapartum Procedures: Episiotomy: None [1]                                         Lacerations:  None [1]  Patient had a delivery of a Viable infant. 06/04/2016  Information for the patient's newborn:  Daniell, Paradise [[id number]]  Delivery Method: Vaginal, Spontaneous Delivery (Filed from Delivery Summary)    Pateint had an uncomplicated postpartum course.  She is ambulating, tolerating a regular diet, passing flatus, and urinating well. Patient is discharged home in stable condition on 06/06/16.    Physical exam Vitals:   06/04/16 1910 06/05/16 0607 06/05/16 1809 06/06/16 0627  BP: 101/65 104/76 122/87 117/82  Pulse: 90 64 (!) 55 65  Resp: 16 16 16 18   Temp: 98.2 F (36.8 C) 98 F (36.7 C) 98.1 F (36.7 C) 98.7 F (37.1 C)  TempSrc: Oral Oral Oral Oral  SpO2:    100%  Weight:      Height:       General: alert and cooperative Lochia: appropriate Uterine Fundus: firm Incision: N/A DVT Evaluation: No evidence of DVT seen on physical exam. Labs: Lab Results  Component Value Date   WBC 15.5 (H) 06/04/2016   HGB 9.6 (L)  06/04/2016   HCT 29.0 (L) 06/04/2016   MCV 81.5 06/04/2016   PLT 264 06/04/2016   CMP Latest Ref Rng & Units 02/20/2016  Glucose 65 - 99 mg/dL 97  BUN 6 - 20 mg/dL <7(W)  Creatinine 2.95 - 1.00 mg/dL 6.21  Sodium 308 - 657 mmol/L 137  Potassium 3.5 - 5.1 mmol/L 3.1(L)  Chloride 101 - 111 mmol/L 108  CO2 22 - 32 mmol/L 20(L)  Calcium 8.9 - 10.3 mg/dL 8.3(L)  Total Protein 6.5 - 8.1 g/dL 8.4(O)  Total Bilirubin 0.3 - 1.2 mg/dL 0.5  Alkaline Phos 38 - 126 U/L 56  AST 15 - 41 U/L 16  ALT 14 - 54 U/L 9(L)    Discharge instruction: per After Visit Summary and "Baby and Me Booklet".  After visit meds:    Medication List  TAKE these medications   ferrous sulfate 325 (65 FE) MG tablet Take 325 mg by mouth 2 (two) times daily with a meal.   ibuprofen 600 MG tablet Commonly known as:  ADVIL,MOTRIN Take 1 tablet (600 mg total) by mouth every 6 (six) hours.   multivitamin-prenatal 27-0.8 MG Tabs tablet Take 1 tablet by mouth daily.   senna-docusate 8.6-50 MG tablet Commonly known as:  Senokot-S Take 2 tablets by mouth daily.       Diet: routine diet  Activity: Advance as tolerated. Pelvic rest for 6 weeks.   Outpatient follow up:6 weeks Follow up Appt:No future appointments. Follow up Visit:No Follow-up on file.  Postpartum contraception: Condoms  Newborn Data: Live born female  Birth Weight: 7 lb 7 oz (3374 g) APGAR: 8, 9  Baby Feeding: Bottle and Breast Disposition:home with mother   06/06/2016 Asiyah Angelene GiovanniZ Mikell, MD  OB FELLOW DISCHARGE ATTESTATION  I have seen and examined this patient and agree with above documentation in the resident's note.   Ernestina PennaNicholas Asuka Dusseau, MD 10:30 AM

## 2016-06-06 NOTE — Lactation Note (Signed)
This note was copied from a baby's chart. Lactation Consultation Note  Patient Name: Denise Castro UJWJX'BToday's Date: 06/06/2016 Reason for consult: Follow-up assessment;Other (Comment);Infant weight loss (6% weight )  Baby is 8155 1/2 hours old, and baby has been consistent at the breast , latching on both sides per mom  And breast are fuller bilaterally. Per mom nipples tender at 1st to latch and improve. LC assessed breast tissue  With moms permission and noted positional strips both nipples ( upper edges of both nipples). LC reviewed  Basics for latching and the importance of firm support to prevent further damage of the nipples, EBM liberally to nipples.  After feedings comfort gels , when warm rinse with warm water place in refrig. And apply EBM , and or coconut oil, shells.  LC also recommended prior to latch - breast massage, hand express, pre- pump to prime the milk ducts and reverse pressure  To elongate the nipple areola complex so the baby can obtain a deeper latch and soreness should improve.  LC recommended following those steps until the soreness improves and if by 4 days after D/C if soreness not improved to call  Back for LC O/P appt. To assess sore nipples. Sore nipple tx and engorgement prevention and tx reviewed.  LC instructed mom on the use shells , hand pump, reason for increase of flange size when milk comes in.  Mom receptive to teaching.  Mother informed of post-discharge support and given phone number to the lactation department, including services for phone call  assistance; out-patient appointments; and breastfeeding support group. List of other breastfeeding resources in the community given  in the handout. Encouraged mother to call for problems or concerns related to breastfeeding.   Maternal Data Has patient been taught Hand Expression?: Yes  Feeding Feeding Type:  (already latched ) Length of feed: 60 min (30 mins each side )  LATCH  Score/Interventions Latch: Grasps breast easily, tongue down, lips flanged, rhythmical sucking. Intervention(s): Adjust position;Assist with latch;Breast massage;Breast compression  Audible Swallowing: Spontaneous and intermittent  Type of Nipple: Everted at rest and after stimulation  Comfort (Breast/Nipple): Filling, red/small blisters or bruises, mild/mod discomfort  Problem noted: Filling;Cracked, bleeding, blisters, bruises;Mild/Moderate discomfort  Hold (Positioning): No assistance needed to correctly position infant at breast. Intervention(s): Breastfeeding basics reviewed;Support Pillows;Position options;Skin to skin  LATCH Score: 9  Lactation Tools Discussed/Used     Consult Status Consult Status: Follow-up Date: 06/06/16 Follow-up type: In-patient    Kathrin Greathouseorio, Dagmawi Venable Ann 06/06/2016, 12:39 PM

## 2016-06-06 NOTE — Clinical Social Work Maternal (Signed)
  CLINICAL SOCIAL WORK MATERNAL/CHILD NOTE  Patient Details  Name: Denise Castro MRN: [id number] Date of Birth: [birth year]  Date:  06/06/2016  Clinical Social Worker Initiating Note:  Laurey Arrow Date/ Time Initiated:  06/06/16/1210     Child's Name:  Denise Castro   Legal Guardian:  Mother   Need for Interpreter:  None   Date of Referral:  06/06/16     Reason for Referral:  Adoption    Referral Source:  [facility] Nurs[facility]   Address:  [address] [place] [address]  Phone number:  [phone]   Household Members:  Self, Significant Other   Natural Supports (not living in the home):  Extended Family, Immediate Family   Professional Supports: None   Employment: Unemployed   Type of Work:     Education:  Database administrator Resources:  [employer]   Other Resources:  [employer], Physicist, medical    Cultural/Religious Considerations Which May Impact Care:  None Reported  Strengths:  Ability to meet basic needs , Home prepared for child , Pediatrician chosen    Risk Factors/Current Problems:  None   Cognitive State:  Alert , Linear Thinking , Goal Oriented    Mood/Affect:  Bright , Calm , Happy , Interested , Comfortable    CSW Assessment: CSW met with MOB to completed an assessment for depression and transportation barriers.  When CSW arrived, MOB introduced her room guest as FOB  Freight forwarder). MOB gave CSW permission to meet with MOB in FOB's presence.  CSW inquired about MOB hx of depression, and MOB denied any hx. CSW educated MOB and FOB about PPD.  CSW informed MOB and FOB of possible supports and interventions to decrease PPD.  CSW also encouraged MOB to seek medical attention if needed for increased signs and symptoms for PPD.  CSW also reviewed safe sleep, and SIDS. MOB and FOB were knowledgeable.  MOB communicated that she has a pack an play for the baby and is aware of safe sleep interventions. CSW inquired about  transportation barriers and MOB reported having car problems and experiencing difficulty getting to scheduled appointments.  CSW educated MOB about Medicaid transportation and encouraged MOB to utilized the resource.  CSW also offered MOB a referral for the [employer], and MOB declined.  CSW thanked MOB for her willingness to meet with her.  MOB did not have any further questions, concerns, or needs at this time.    CSW Plan/Description:  Patient/Family Education , No Further Intervention Required/No Barriers to Discharge, Information/Referral to [place], MSW, [employer] Social Work [phone]    Dimple Nanas, LCSW 06/06/2016, 12:12 PM

## 2018-01-13 IMAGING — US US OB COMP +14 WK
1 series · 14 of 18 positions shown · non-contrast
Comparison: none

CLINICAL DATA: Severe abdominal pain

EXAM:
LIMITED OBSTETRIC ULTRASOUND

[Series 1: us ob comp +14 wk · 0.23mm/px · 18 acquisitions, 14 frames shown]
[im 1/18]
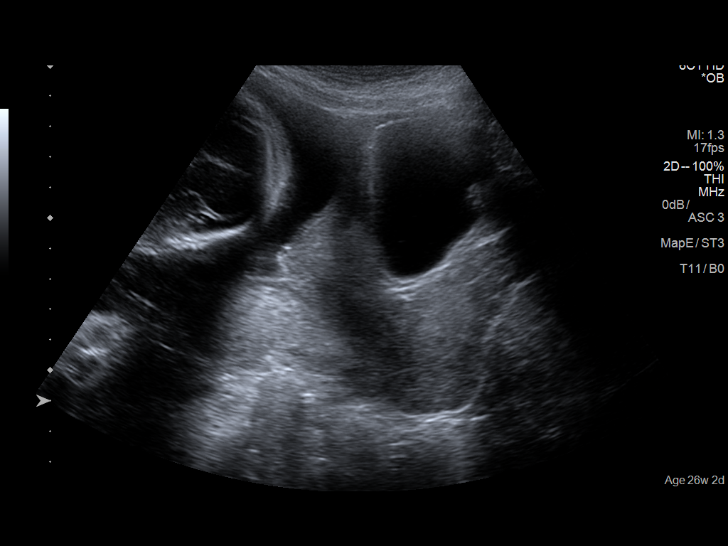
[im 2/18]
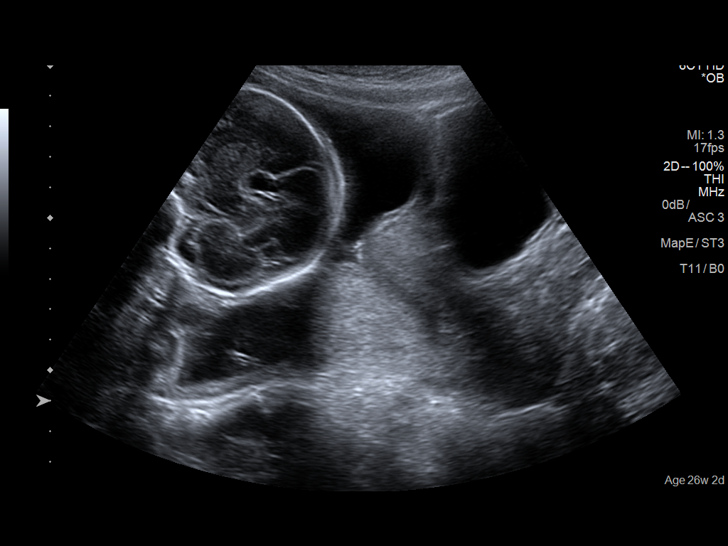
[im 4/18]
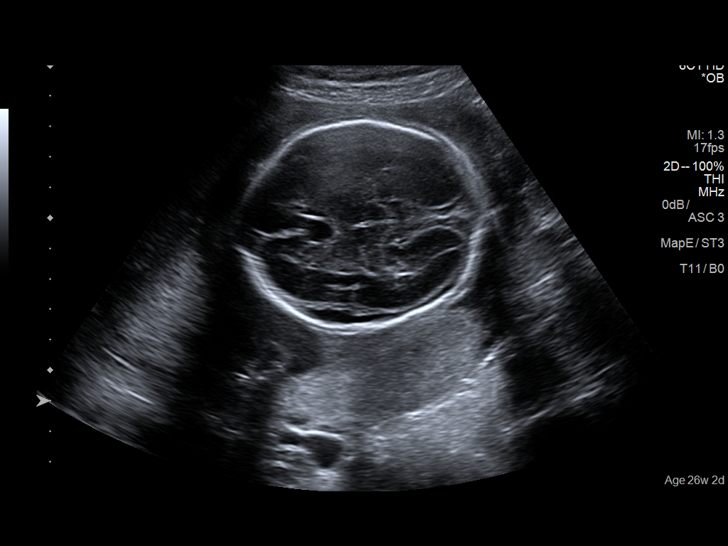
[im 5/18]
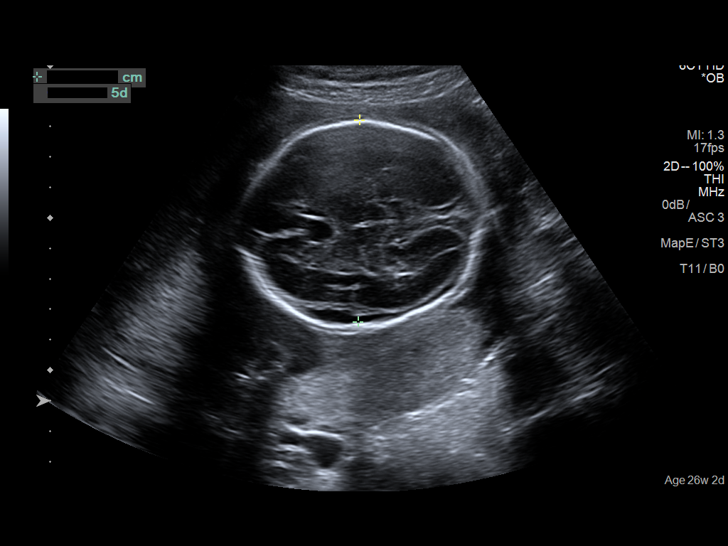
[im 6/18]
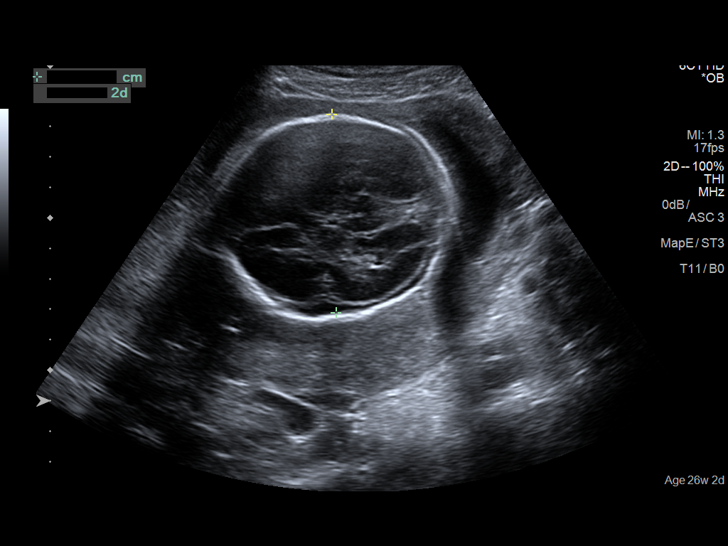
[im 8/18]
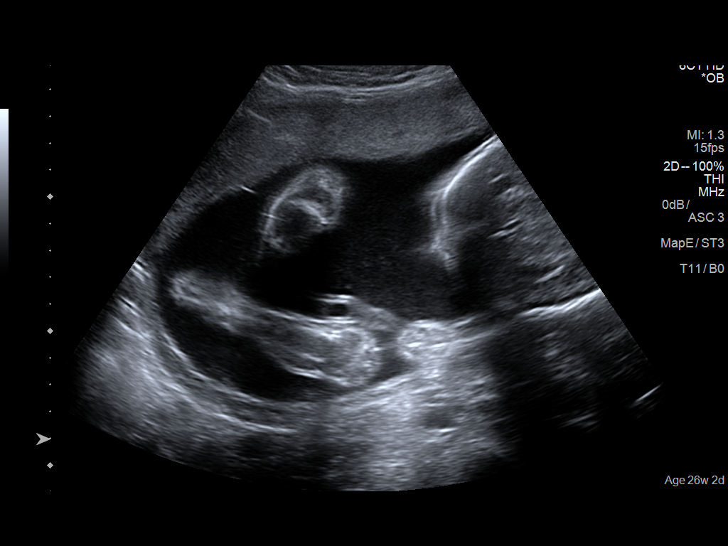
[im 9/18]
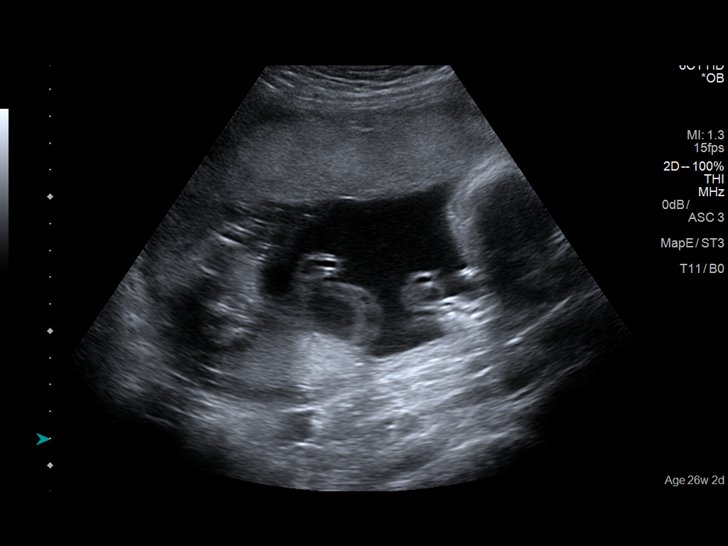
[im 10/18]
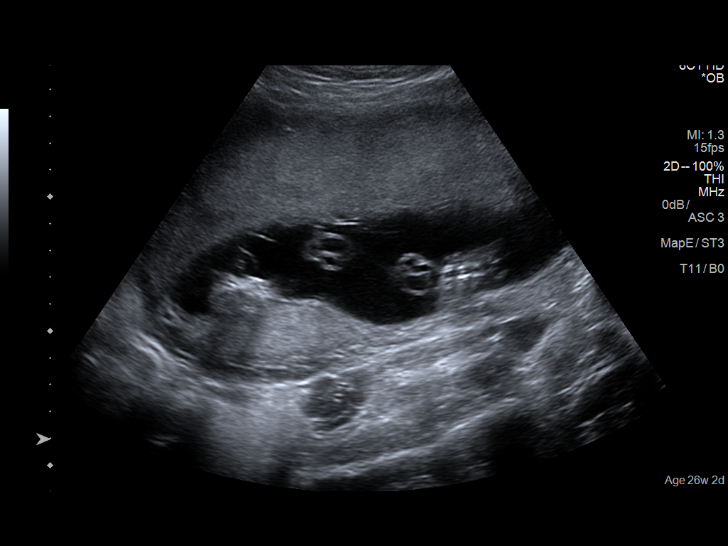
[im 11/18]
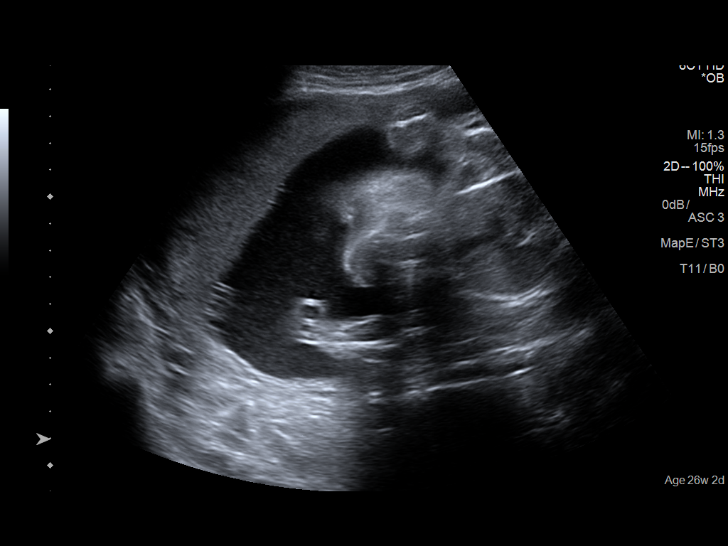
[im 13/18]
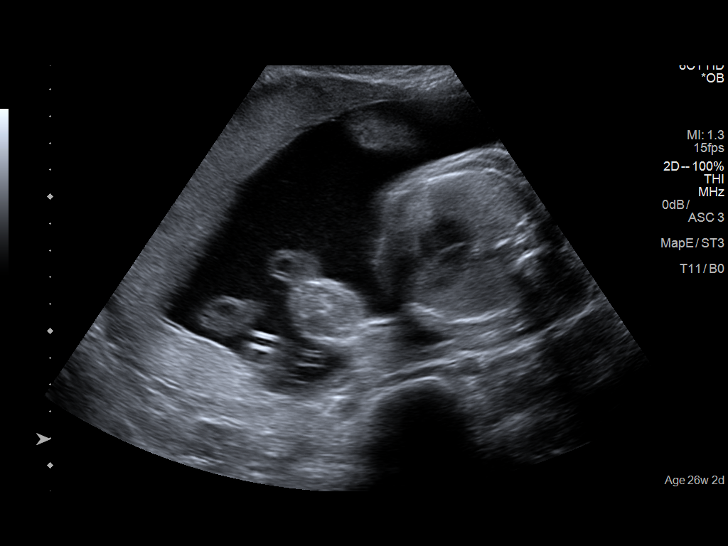
[im 14/18]
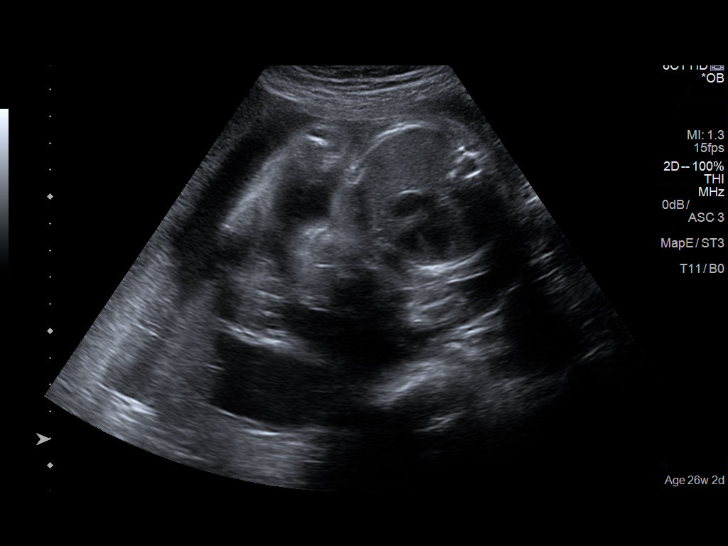
[im 15/18]
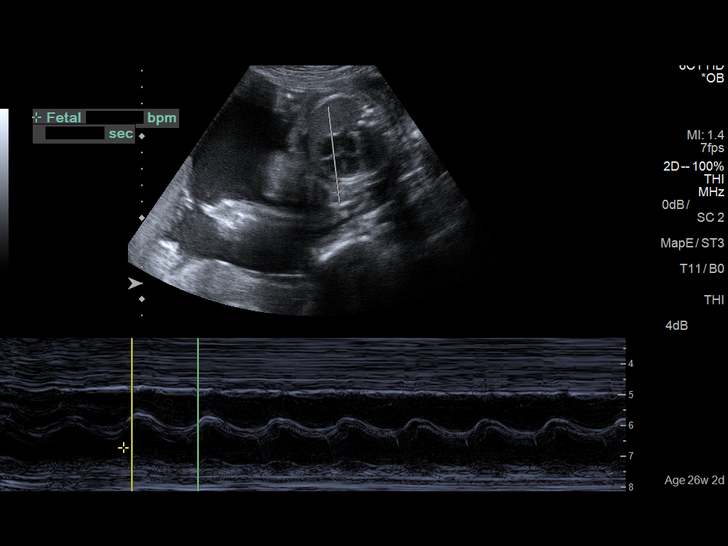
[im 17/18]
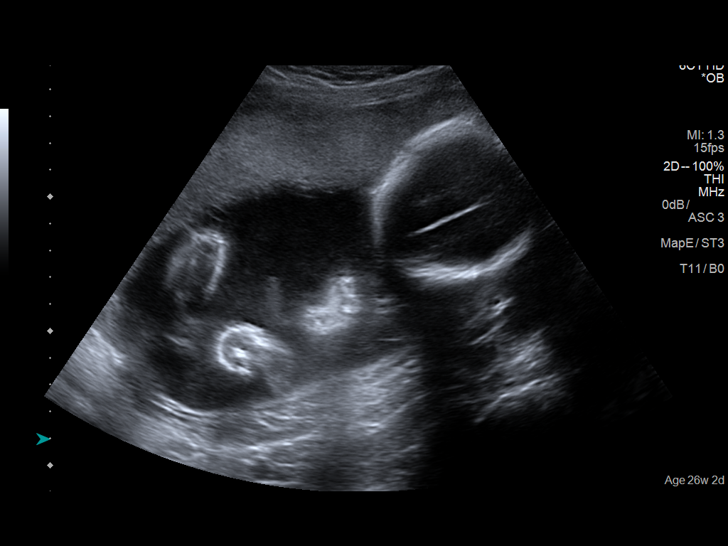
[im 18/18]
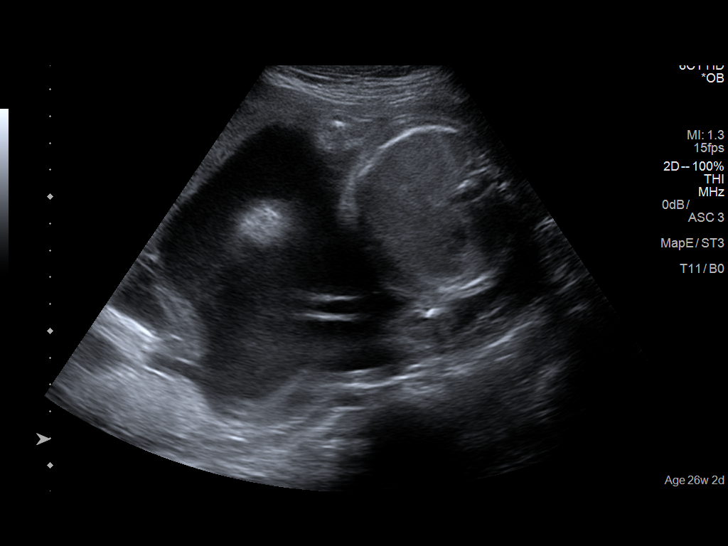

[14 of 18 positions shown; findings below may reference images not displayed]

FINDINGS: Number of Fetuses:  1

Heart Rate:  155 bpm

Movement:  Present

Presentation: Cephalic

Placental Location: Lateral right

Previa: No

Amniotic Fluid (Subjective):  Within normal limits.

BPD:  6.57cm 26w  4d

MATERNAL FINDINGS:

Cervix:  Appears closed.

Uterus/Adnexae:  No abnormality visualized.
IMPRESSION: There is single live intrauterine gestation in cephalic
presentation. No placenta previa. Fetal heart rate 155 BPM. Normal
appearing amniotic fluid. BPD measures 6.57 cm corresponding to
gestational age 26 weeks and 4 days. EDC by ultrasound 05/24/2016.
The cervix appears closed.

This exam is performed on an emergent basis and does not
comprehensively evaluate fetal size, dating, or anatomy; follow-up
complete OB US should be considered if further fetal assessment is
warranted.
